# Patient Record
Sex: Female | Born: 1970 | Race: Black or African American | Hispanic: No | Marital: Married | Smoking: Former smoker
Health system: Southern US, Community
[De-identification: ages and names within clinical notes are randomized; demographics above are authoritative.]

## PROBLEM LIST (undated history)

## (undated) DIAGNOSIS — R202 Paresthesia of skin: Secondary | ICD-10-CM

## (undated) DIAGNOSIS — K219 Gastro-esophageal reflux disease without esophagitis: Secondary | ICD-10-CM

## (undated) DIAGNOSIS — F419 Anxiety disorder, unspecified: Secondary | ICD-10-CM

## (undated) DIAGNOSIS — I10 Essential (primary) hypertension: Secondary | ICD-10-CM

## (undated) HISTORY — DX: Gastro-esophageal reflux disease without esophagitis: K21.9

## (undated) HISTORY — PX: HERNIA REPAIR: SHX51

---

## 2000-01-12 ENCOUNTER — Emergency Department (HOSPITAL_COMMUNITY): Admission: EM | Admit: 2000-01-12 | Discharge: 2000-01-12 | Payer: Self-pay | Admitting: Emergency Medicine

## 2000-04-24 ENCOUNTER — Emergency Department (HOSPITAL_COMMUNITY): Admission: EM | Admit: 2000-04-24 | Discharge: 2000-04-24 | Payer: Self-pay | Admitting: Emergency Medicine

## 2003-09-17 ENCOUNTER — Emergency Department (HOSPITAL_COMMUNITY): Admission: EM | Admit: 2003-09-17 | Discharge: 2003-09-17 | Payer: Self-pay | Admitting: Emergency Medicine

## 2015-06-16 ENCOUNTER — Encounter (HOSPITAL_COMMUNITY): Payer: Self-pay | Admitting: Vascular Surgery

## 2015-06-16 ENCOUNTER — Emergency Department (HOSPITAL_COMMUNITY): Payer: BC Managed Care – PPO

## 2015-06-16 ENCOUNTER — Emergency Department (HOSPITAL_COMMUNITY)
Admission: EM | Admit: 2015-06-16 | Discharge: 2015-06-16 | Disposition: A | Discharge status: Home / Self Care | Payer: BC Managed Care – PPO | Attending: Emergency Medicine | Admitting: Emergency Medicine

## 2015-06-16 DIAGNOSIS — F17210 Nicotine dependence, cigarettes, uncomplicated: Secondary | ICD-10-CM | POA: Diagnosis not present

## 2015-06-16 DIAGNOSIS — H9311 Tinnitus, right ear: Secondary | ICD-10-CM | POA: Diagnosis not present

## 2015-06-16 DIAGNOSIS — Y998 Other external cause status: Secondary | ICD-10-CM | POA: Insufficient documentation

## 2015-06-16 DIAGNOSIS — S199XXA Unspecified injury of neck, initial encounter: Secondary | ICD-10-CM | POA: Insufficient documentation

## 2015-06-16 DIAGNOSIS — Y92410 Unspecified street and highway as the place of occurrence of the external cause: Secondary | ICD-10-CM | POA: Diagnosis not present

## 2015-06-16 DIAGNOSIS — Y9389 Activity, other specified: Secondary | ICD-10-CM | POA: Diagnosis not present

## 2015-06-16 MED ORDER — ACETAMINOPHEN 500 MG PO TABS: 500.0000 mg | ORAL_TABLET | Freq: Four times a day (QID) | ORAL | Status: DC | PRN

## 2015-06-16 NOTE — ED Notes (Signed)
Pt ambulating independently w/ steady gait on d/c in no acute distress, A&Ox4. D/c instructions reviewed w/ pt and family - pt and family deny any further questions or concerns at present. Rx given x1  
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
## 2015-06-16 NOTE — ED Notes (Signed)
Patient transported to X-ray 

## 2015-06-16 NOTE — ED Notes (Signed)
See PAs note for secondary.
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
## 2015-11-24 ENCOUNTER — Other Ambulatory Visit (HOSPITAL_COMMUNITY)
Admission: RE | Admit: 2015-11-24 | Discharge: 2015-11-24 | Disposition: A | Discharge status: Home / Self Care | Payer: BC Managed Care – PPO | Source: Ambulatory Visit | Attending: Family Medicine | Admitting: Family Medicine

## 2015-11-24 ENCOUNTER — Other Ambulatory Visit: Payer: Self-pay | Admitting: Family Medicine

## 2015-11-24 DIAGNOSIS — Z113 Encounter for screening for infections with a predominantly sexual mode of transmission: Secondary | ICD-10-CM | POA: Diagnosis present

## 2015-11-24 DIAGNOSIS — Z01419 Encounter for gynecological examination (general) (routine) without abnormal findings: Secondary | ICD-10-CM | POA: Insufficient documentation

## 2015-11-24 DIAGNOSIS — Z1151 Encounter for screening for human papillomavirus (HPV): Secondary | ICD-10-CM | POA: Insufficient documentation

## 2015-11-24 DIAGNOSIS — N760 Acute vaginitis: Secondary | ICD-10-CM | POA: Insufficient documentation

## 2015-11-28 LAB — CYTOLOGY - PAP

## 2019-10-07 ENCOUNTER — Ambulatory Visit: Payer: BC Managed Care – PPO

## 2019-10-14 ENCOUNTER — Ambulatory Visit: Payer: BC Managed Care – PPO | Attending: Family

## 2019-10-14 DIAGNOSIS — Z23 Encounter for immunization: Secondary | ICD-10-CM

## 2019-10-14 NOTE — Progress Notes (Signed)
   Covid-19 Vaccination Clinic  Name:  Pamela Ellis    MRN: [id number] DOB: 08/08/1970  10/14/2019  [name] was observed post Covid-19 immunization for 15 minutes without incident. She was provided with Vaccine Information Sheet and instruction to access the V-Safe system.   [name] was instructed to call 911 with any severe reactions post vaccine: Marland Kitchen Difficulty breathing  . Swelling of face and throat  . A fast heartbeat  . A bad rash all over body  . Dizziness and weakness   Immunizations Administered    Name Date Dose VIS Date Route   Moderna COVID-19 Vaccine 10/14/2019  3:52 PM 0.5 mL 06/22/2019 Intramuscular   Manufacturer: Moderna   Lot: [id number]   NDC: 25498-264-15

## 2019-11-16 ENCOUNTER — Ambulatory Visit: Payer: BC Managed Care – PPO | Attending: Family

## 2019-11-16 DIAGNOSIS — Z23 Encounter for immunization: Secondary | ICD-10-CM

## 2019-11-16 NOTE — Progress Notes (Signed)
   Covid-19 Vaccination Clinic  Name:  Pamela Ellis    MRN: [id number] DOB: Feb 19, 1971  11/16/2019  Pamela Ellis was observed post Covid-19 immunization for 15 minutes without incident. She was provided with Vaccine Information Sheet and instruction to access the V-Safe system.   Pamela Ellis was instructed to call 911 with any severe reactions post vaccine: Marland Kitchen Difficulty breathing  . Swelling of face and throat  . A fast heartbeat  . A bad rash all over body  . Dizziness and weakness   Immunizations Administered    Name Date Dose VIS Date Route   Moderna COVID-19 Vaccine 11/16/2019  3:32 PM 0.5 mL 06/2019 Intramuscular   Manufacturer: Moderna   Lot: [id number]   NDC: 30865-784-69

## 2020-03-09 ENCOUNTER — Other Ambulatory Visit: Payer: Self-pay | Admitting: Family Medicine

## 2020-03-09 DIAGNOSIS — Z1231 Encounter for screening mammogram for malignant neoplasm of breast: Secondary | ICD-10-CM

## 2020-03-15 ENCOUNTER — Other Ambulatory Visit: Payer: Self-pay

## 2020-03-15 ENCOUNTER — Ambulatory Visit
Admission: RE | Admit: 2020-03-15 | Discharge: 2020-03-15 | Disposition: A | Discharge status: Home / Self Care | Payer: BC Managed Care – PPO | Source: Ambulatory Visit | Attending: Family Medicine | Admitting: Family Medicine

## 2020-03-15 DIAGNOSIS — Z1231 Encounter for screening mammogram for malignant neoplasm of breast: Secondary | ICD-10-CM

## 2021-10-19 ENCOUNTER — Encounter: Payer: Self-pay | Admitting: Neurology

## 2021-12-24 ENCOUNTER — Other Ambulatory Visit: Payer: Self-pay

## 2021-12-24 ENCOUNTER — Emergency Department (HOSPITAL_BASED_OUTPATIENT_CLINIC_OR_DEPARTMENT_OTHER)
Admission: EM | Admit: 2021-12-24 | Discharge: 2021-12-24 | Disposition: A | Discharge status: Home / Self Care | Payer: BC Managed Care – PPO | Attending: Emergency Medicine | Admitting: Emergency Medicine

## 2021-12-24 ENCOUNTER — Emergency Department (HOSPITAL_BASED_OUTPATIENT_CLINIC_OR_DEPARTMENT_OTHER): Payer: BC Managed Care – PPO

## 2021-12-24 DIAGNOSIS — M549 Dorsalgia, unspecified: Secondary | ICD-10-CM | POA: Diagnosis not present

## 2021-12-24 DIAGNOSIS — R072 Precordial pain: Secondary | ICD-10-CM | POA: Insufficient documentation

## 2021-12-24 DIAGNOSIS — R079 Chest pain, unspecified: Secondary | ICD-10-CM | POA: Diagnosis present

## 2021-12-24 LAB — BASIC METABOLIC PANEL
Anion gap: 7 (ref 5–15)
BUN: 8 mg/dL (ref 6–20)
CO2: 25 mmol/L (ref 22–32)
Calcium: 9.3 mg/dL (ref 8.9–10.3)
Chloride: 106 mmol/L (ref 98–111)
Creatinine, Ser: 0.77 mg/dL (ref 0.44–1.00)
GFR, Estimated: >60 mL/min (ref >60)
Glucose, Bld: 100 mg/dL — ABNORMAL HIGH (ref 70–99)
Potassium: 4.1 mmol/L (ref 3.5–5.1)
Sodium: 138 mmol/L (ref 135–145)

## 2021-12-24 LAB — CBC
HCT: 40.6 % (ref 36.0–46.0)
Hemoglobin: 13.1 g/dL (ref 12.0–15.0)
MCH: 27.3 pg (ref 26.0–34.0)
MCHC: 32.3 g/dL (ref 30.0–36.0)
MCV: 84.6 fL (ref 80.0–100.0)
Platelets: 310 10*3/uL (ref 150–400)
RBC: 4.8 MIL/uL (ref 3.87–5.11)
RDW: 13.3 % (ref 11.5–15.5)
WBC: 6 10*3/uL (ref 4.0–10.5)
nRBC: 0 % (ref 0.0–0.2)

## 2021-12-24 LAB — HEPATIC FUNCTION PANEL
ALT: 11 U/L (ref 0–44)
AST: 16 U/L (ref 15–41)
Albumin: 3.8 g/dL (ref 3.5–5.0)
Alkaline Phosphatase: 94 U/L (ref 38–126)
Bilirubin, Direct: <0.1 mg/dL (ref 0.0–0.2)
Total Bilirubin: 0.5 mg/dL (ref 0.3–1.2)
Total Protein: 7.9 g/dL (ref 6.5–8.1)

## 2021-12-24 LAB — TROPONIN I (HIGH SENSITIVITY)
Troponin I (High Sensitivity): <2 ng/L (ref <18)
Troponin I (High Sensitivity): <2 ng/L (ref <18)

## 2021-12-24 LAB — PREGNANCY, URINE: Preg Test, Ur: NEGATIVE

## 2021-12-24 LAB — LIPASE, BLOOD: Lipase: 21 U/L (ref 11–51)

## 2021-12-24 MED ORDER — LIDOCAINE VISCOUS HCL 2 % MT SOLN
15.0000 mL | Freq: Once | OROMUCOSAL | Status: AC
  Administered 2021-12-24: 15 mL via ORAL
  Filled 2021-12-24: qty 15

## 2021-12-24 MED ORDER — PANTOPRAZOLE SODIUM 40 MG PO TBEC: 40.0000 mg | DELAYED_RELEASE_TABLET | Freq: Every day | ORAL | 0 refills | Status: DC

## 2021-12-24 MED ORDER — ALUM & MAG HYDROXIDE-SIMETH 200-200-20 MG/5ML PO SUSP
30.0000 mL | Freq: Once | ORAL | Status: AC
  Administered 2021-12-24: 30 mL via ORAL
  Filled 2021-12-24: qty 30

## 2021-12-24 MED ORDER — DICYCLOMINE HCL 20 MG PO TABS: 20.0000 mg | ORAL_TABLET | Freq: Three times a day (TID) | ORAL | 0 refills | Status: DC | PRN

## 2021-12-24 NOTE — ED Triage Notes (Signed)
C/O a "crawling / prickly sensation" going from lower back up for few weeks; and the past few days now feeling it in the chest and abdominal area;

## 2021-12-24 NOTE — Discharge Instructions (Addendum)
You are seen in the department today with back and chest discomfort.  Your lab work did not show evidence of heart attack but I would like for you to follow with a cardiologist.  I have placed a referral to help with this.  Please follow with your primary care doctor to continue symptom management.  If you develop any new or suddenly worsening symptoms please return to the emergency department for reevaluation.

## 2021-12-24 NOTE — ED Provider Notes (Signed)
Emergency Department Provider Note   I have reviewed the triage vital signs and the nursing notes.   HISTORY  Chief Complaint Back Pain and Chest Pain   HPI Pamela Ellis is a 51 y.o. female presents to the ED "tingly" discomfort in the right back for the last several months now newly radiating to the right lateral chest. Denies any rash. No fever. No abdominal pain or vomiting. No similar pain in the past. No pleuritic pain.   No past medical history on file.  Review of Systems {** Revise as appropriate then delete this line - Documentation of 10 systems OR 2 systems and "10-point ROS otherwise negative" is required **}Constitutional: No fever/chills Eyes: No visual changes. ENT: No sore throat. Cardiovascular: Denies chest pain. Respiratory: Denies shortness of breath. Gastrointestinal: No abdominal pain.  No nausea, no vomiting.  No diarrhea.  No constipation. Genitourinary: Negative for dysuria. Musculoskeletal: Negative for back pain. Skin: Negative for rash. Neurological: Negative for headaches, focal weakness or numbness. {**Psychiatric:  Endocrine:  Hematological/Lymphatic:  Allergic/Immunilogical: **}  ____________________________________________   PHYSICAL EXAM:  VITAL SIGNS: ED Triage Vitals  Enc Vitals Group     BP 12/24/21 1122 (!) 146/109     Pulse Rate 12/24/21 1122 78     Resp 12/24/21 1122 17     Temp 12/24/21 1122 98.3 F (36.8 C)     Temp Source 12/24/21 1122 Oral     SpO2 12/24/21 1122 100 %     Weight 12/24/21 1124 190 lb (86.2 kg)     Height 12/24/21 1124 5\' 4"  (1.626 m)     Head Circumference --      Peak Flow --      Pain Score 12/24/21 1124 10     Pain Loc --      Pain Edu? --      Excl. in GC? --    {** Revise as appropriate then delete this line - 8 systems required **} Constitutional: Alert and oriented. Well appearing and in no acute distress. Eyes: Conjunctivae are normal. PERRL. EOMI. Head: Atraumatic. {**Ears:   Healthy appearing ear canals and TMs bilaterally **}Nose: No congestion/rhinnorhea. Mouth/Throat: Mucous membranes are moist.  Oropharynx non-erythematous. Neck: No stridor.  No meningeal signs.  {**No cervical spine tenderness to palpation.**} Cardiovascular: Normal rate, regular rhythm. Good peripheral circulation. Grossly normal heart sounds.   Respiratory: Normal respiratory effort.  No retractions. Lungs CTAB. Gastrointestinal: Soft and nontender. No distention.  {**Genitourinary:  **}Musculoskeletal: No lower extremity tenderness nor edema. No gross deformities of extremities. Neurologic:  Normal speech and language. No gross focal neurologic deficits are appreciated.  Skin:  Skin is warm, dry and intact. No rash noted. {**Psychiatric: Mood and affect are normal. Speech and behavior are normal.**}  ____________________________________________   LABS (all labs ordered are listed, but only abnormal results are displayed)  Labs Reviewed  BASIC METABOLIC PANEL - Abnormal; Notable for the following components:      Result Value   Glucose, Bld 100 (*)    All other components within normal limits  CBC  PREGNANCY, URINE  HEPATIC FUNCTION PANEL  LIPASE, BLOOD  TROPONIN I (HIGH SENSITIVITY)  TROPONIN I (HIGH SENSITIVITY)   ____________________________________________  EKG   EKG Interpretation  Date/Time:  Monday December 24 2021 11:44:12 EDT Ventricular Rate:  61 PR Interval:  128 QRS Duration: 72 QT Interval:  400 QTC Calculation: 402 R Axis:   53 Text Interpretation: Normal sinus rhythm Normal ECG No previous ECGs available Confirmed  by Alona Bene [phone]) on 12/24/2021 3:07:05 PM        ____________________________________________  RADIOLOGY  DG Chest 2 View  Result Date: 12/24/2021 CLINICAL DATA:  Chest pain.  Abnormal sensation. EXAM: CHEST - 2 VIEW COMPARISON:  None FINDINGS: Heart size is normal. Mediastinal shadows are normal. The lungs are clear. No bronchial  thickening. No infiltrate, mass, effusion or collapse. Pulmonary vascularity is normal. No bony abnormality. IMPRESSION: Normal chest Electronically Signed   By: Paulina Fusi M.D.   On: 12/24/2021 11:38    ____________________________________________   PROCEDURES  Procedure(s) performed:   Procedures   ____________________________________________   INITIAL IMPRESSION / ASSESSMENT AND PLAN / ED COURSE  Pertinent labs & imaging results that were available during my care of the patient were reviewed by me and considered in my medical decision making (see chart for details).   This patient is Presenting for Evaluation of ***, which {Range:23949} require a range of treatment options, and {MDMcomplaint:23950} a complaint that involves a {MDMlevelrisk:23951} risk of morbidity and mortality.  The Differential Diagnoses include***.  Critical Interventions-    Medications  alum & mag hydroxide-simeth (MAALOX/MYLANTA) 200-200-20 MG/5ML suspension 30 mL (has no administration in time range)    And  lidocaine (XYLOCAINE) 2 % viscous mouth solution 15 mL (has no administration in time range)    Reassessment after intervention:     I *** Additional Historical Information from ***, as the patient is ***.  I decided to review pertinent External Data, and in summary ***.   Clinical Laboratory Tests Ordered, included   Radiologic Tests Ordered, included ***. I independently interpreted the images and agree with radiology interpretation.   Cardiac Monitor Tracing which shows ***   Social Determinants of Health Risk ***  Consult complete with  Medical Decision Making: Summary: ***  Reevaluation with update and discussion with   ***Considered admission***  Disposition:   ____________________________________________  FINAL CLINICAL IMPRESSION(S) / ED DIAGNOSES  Final diagnoses:  None     NEW OUTPATIENT MEDICATIONS STARTED DURING THIS VISIT:  New Prescriptions   No  medications on file    Note:  This document was prepared using Dragon voice recognition software and may include unintentional dictation errors.  Alona Bene, MD, [facility] Emergency Medicine

## 2021-12-24 NOTE — ED Provider Notes (Incomplete)
Emergency Department Provider Note   I have reviewed the triage vital signs and the nursing notes.   HISTORY  Chief Complaint Back Pain and Chest Pain   HPI Pamela Ellis is a 51 y.o. female ***   {**SYMPTOM/COMPLAINT  LOCATION (describe anatomically) DURATION (when did it start) TIMING (onset and pattern) SEVERITY (0-10, mild/moderate/severe) QUALITY (description of symptoms) CONTEXT (recent surgery, new meds, activity, etc.) MODIFYINGFACTORS (what makes it better/worse) ASSOCIATEDSYMPTOMS (pertinent positives and negatives)**}  No past medical history on file.  Review of Systems {** Revise as appropriate then delete this line - Documentation of 10 systems OR 2 systems and "10-point ROS otherwise negative" is required **}Constitutional: No fever/chills Eyes: No visual changes. ENT: No sore throat. Cardiovascular: Denies chest pain. Respiratory: Denies shortness of breath. Gastrointestinal: No abdominal pain.  No nausea, no vomiting.  No diarrhea.  No constipation. Genitourinary: Negative for dysuria. Musculoskeletal: Negative for back pain. Skin: Negative for rash. Neurological: Negative for headaches, focal weakness or numbness. {**Psychiatric:  Endocrine:  Hematological/Lymphatic:  Allergic/Immunilogical: **}  ____________________________________________   PHYSICAL EXAM:  VITAL SIGNS: ED Triage Vitals  Enc Vitals Group     BP 12/24/21 1122 (!) 146/109     Pulse Rate 12/24/21 1122 78     Resp 12/24/21 1122 17     Temp 12/24/21 1122 98.3 F (36.8 C)     Temp Source 12/24/21 1122 Oral     SpO2 12/24/21 1122 100 %     Weight 12/24/21 1124 190 lb (86.2 kg)     Height 12/24/21 1124 5\' 4"  (1.626 m)     Head Circumference --      Peak Flow --      Pain Score 12/24/21 1124 10     Pain Loc --      Pain Edu? --      Excl. in GC? --    {** Revise as appropriate then delete this line - 8 systems required **} Constitutional: Alert and oriented. Well  appearing and in no acute distress. Eyes: Conjunctivae are normal. PERRL. EOMI. Head: Atraumatic. {**Ears:  Healthy appearing ear canals and TMs bilaterally **}Nose: No congestion/rhinnorhea. Mouth/Throat: Mucous membranes are moist.  Oropharynx non-erythematous. Neck: No stridor.  No meningeal signs.  {**No cervical spine tenderness to palpation.**} Cardiovascular: Normal rate, regular rhythm. Good peripheral circulation. Grossly normal heart sounds.   Respiratory: Normal respiratory effort.  No retractions. Lungs CTAB. Gastrointestinal: Soft and nontender. No distention.  {**Genitourinary:  **}Musculoskeletal: No lower extremity tenderness nor edema. No gross deformities of extremities. Neurologic:  Normal speech and language. No gross focal neurologic deficits are appreciated.  Skin:  Skin is warm, dry and intact. No rash noted. {**Psychiatric: Mood and affect are normal. Speech and behavior are normal.**}  ____________________________________________   LABS (all labs ordered are listed, but only abnormal results are displayed)  Labs Reviewed  BASIC METABOLIC PANEL - Abnormal; Notable for the following components:      Result Value   Glucose, Bld 100 (*)    All other components within normal limits  CBC  PREGNANCY, URINE  HEPATIC FUNCTION PANEL  LIPASE, BLOOD  TROPONIN I (HIGH SENSITIVITY)  TROPONIN I (HIGH SENSITIVITY)   ____________________________________________  EKG  *** ____________________________________________  RADIOLOGY  DG Chest 2 View  Result Date: 12/24/2021 CLINICAL DATA:  Chest pain.  Abnormal sensation. EXAM: CHEST - 2 VIEW COMPARISON:  None FINDINGS: Heart size is normal. Mediastinal shadows are normal. The lungs are clear. No bronchial thickening. No infiltrate, mass, effusion  or collapse. Pulmonary vascularity is normal. No bony abnormality. IMPRESSION: Normal chest Electronically Signed   By: Paulina Fusi M.D.   On: 12/24/2021 11:38     ____________________________________________   PROCEDURES  Procedure(s) performed:   Procedures   ____________________________________________   INITIAL IMPRESSION / ASSESSMENT AND PLAN / ED COURSE  Pertinent labs & imaging results that were available during my care of the patient were reviewed by me and considered in my medical decision making (see chart for details).   This patient is Presenting for Evaluation of ***, which {Range:23949} require a range of treatment options, and {MDMcomplaint:23950} a complaint that involves a {MDMlevelrisk:23951} risk of morbidity and mortality.  The Differential Diagnoses include***.  Critical Interventions-    Medications - No data to display  Reassessment after intervention:     I *** Additional Historical Information from ***, as the patient is ***.  I decided to review pertinent External Data, and in summary ***.   Clinical Laboratory Tests Ordered, included   Radiologic Tests Ordered, included ***. I independently interpreted the images and agree with radiology interpretation.   Cardiac Monitor Tracing which shows ***   Social Determinants of Health Risk ***  Consult complete with  Medical Decision Making: Summary: ***  Reevaluation with update and discussion with   ***Considered admission***  Disposition:   ____________________________________________  FINAL CLINICAL IMPRESSION(S) / ED DIAGNOSES  Final diagnoses:  None     NEW OUTPATIENT MEDICATIONS STARTED DURING THIS VISIT:  New Prescriptions   No medications on file    Note:  This document was prepared using Dragon voice recognition software and may include unintentional dictation errors.  Alona Bene, MD, [facility]

## 2021-12-24 NOTE — ED Notes (Signed)
PT states pain is the same as arrival at this time

## 2021-12-26 ENCOUNTER — Ambulatory Visit: Payer: BC Managed Care – PPO | Admitting: Cardiology

## 2021-12-26 ENCOUNTER — Encounter: Payer: Self-pay | Admitting: Cardiology

## 2021-12-26 DIAGNOSIS — R202 Paresthesia of skin: Secondary | ICD-10-CM

## 2021-12-26 NOTE — Addendum Note (Signed)
Addended by: Molli Barrows on: 12/26/2021 03:18 PM   Modules accepted: Orders

## 2021-12-26 NOTE — Progress Notes (Signed)
Cardiology Office Note:    Date:  12/26/2021   ID:  Pamela Ellis, Pamela Ellis [birth year], MRN [id number]  PCP:  Wilfrid Lund, PA   [facility] Providers Cardiologist:  Donato Schultz, MD     Referring MD: Maia Plan, MD    History of Present Illness:    Pamela Ellis is a 51 y.o. female here for evaluation of right-sided chest pain at the request of Dr. Jacqulyn Bath.  She had an emergency department visit on 12/24/2021.  Which she describes as a sensation of being uncomfortable with tingling-like chest discomfort especially when sitting straight up for a period of time.  This has been going on since December.  She feels like something is "sick "in there.  The pain then recently started banding around her right chest wall under her right breast.  She has had shingles vaccine.  She has not noticed any rashes.  She is not having any exertional component.  When she lays down on her side she feels better.    No rashes no fever no chills no syncope.  Troponin values were normal.  Normal hemoglobin.  Pregnancy test was normal.  No past medical history on file.  Past Surgical History:  Procedure Laterality Date   HERNIA REPAIR      Current Medications: Current Meds  Medication Sig   acetaminophen (TYLENOL) 500 MG tablet Take 1 tablet (500 mg total) by mouth every 6 (six) hours as needed.   dicyclomine (BENTYL) 20 MG tablet Take 1 tablet (20 mg total) by mouth 3 (three) times daily as needed for spasms.   pantoprazole (PROTONIX) 40 MG tablet Take 1 tablet (40 mg total) by mouth daily.     Allergies:   Patient has no known allergies.   Social History   Socioeconomic History   Marital status: Married    Spouse name: Not on file   Number of children: Not on file   Years of education: Not on file   Highest education level: Not on file  Occupational History   Not on file  Tobacco Use   Smoking status: Every Day    Packs/day: 0.20    Types: Cigarettes   Smokeless tobacco: Never   Substance and Sexual Activity   Alcohol use: Yes    Comment: occasionally   Drug use: No   Sexual activity: Not on file  Other Topics Concern   Not on file  Social History Narrative   Not on file   Social Determinants of Health   Financial Resource Strain: Not on file  Food Insecurity: Not on file  Transportation Needs: Not on file  Physical Activity: Not on file  Stress: Not on file  Social Connections: Not on file     Family History: The patient's no early family history of CAD   ROS:   Please see the history of present illness.    Denies any fevers chills nausea vomiting, no weight loss no rash all other systems reviewed and are negative.  EKGs/Labs/Other Studies Reviewed:    The following studies were reviewed today: Prior ER studies reviewed.  Chest x-ray personally reviewed shows no abnormality.  EKG:  EKG on 12/24/2021 shows sinus rhythm 61 with no other abnormalities  Recent Labs: 12/24/2021: ALT 11; BUN 8; Creatinine, Ser 0.77; Hemoglobin 13.1; Platelets 310; Potassium 4.1; Sodium 138  Recent Lipid Panel No results found for: CHOL, TRIG, HDL, CHOLHDL, VLDL, LDLCALC, LDLDIRECT   Risk Assessment/Calculations:  Physical Exam:    VS:  BP 120/80 (BP Location: Left Arm, Patient Position: Sitting, Cuff Size: Normal)   Pulse 85   Ht 5\' 4"  (1.626 m)   Wt 184 lb (83.5 kg)   SpO2 97%   BMI 31.58 kg/m     Wt Readings from Last 3 Encounters:  12/26/21 184 lb (83.5 kg)  12/24/21 190 lb (86.2 kg)     GEN:  Well nourished, well developed in no acute distress HEENT: Normal NECK: No JVD; No carotid bruits LYMPHATICS: No lymphadenopathy CARDIAC: RRR, no murmurs, no rubs, gallops RESPIRATORY:  Clear to auscultation without rales, wheezing or rhonchi  ABDOMEN: Soft, non-tender, non-distended MUSCULOSKELETAL:  No edema; No deformity  SKIN: Warm and dry NEUROLOGIC:  Alert and oriented x 3, normal grip strength, no obvious weakness PSYCHIATRIC:   Normal affect   ASSESSMENT:    1. Paresthesias    PLAN:    In order of problems listed above:  Paresthesias Paresthesias noted mostly right/under right breast.  Can also encompass right shoulder region.  She is mostly uncomfortable when sitting up straight for several minutes.  I wonder if there is thoracic spinal stenosis present.  She has not been able to get in with neurology until September.  We will go ahead and order her an MRI of the spine for further evaluation.  We discussed shingles however she has never had a rash and some of her symptoms have been present since December.  If MRI is unremarkable, physical therapy may be helpful.  I do not feel that any further cardiac evaluation is necessary at this time.         Medication Adjustments/Labs and Tests Ordered: Current medicines are reviewed at length with the patient today.  Concerns regarding medicines are outlined above.  Orders Placed This Encounter  Procedures   MR TOTAL SPINE W WO CONTRAST   No orders of the defined types were placed in this encounter.   Patient Instructions  Medication Instructions:  Your physician recommends that you continue on your current medications as directed. Please refer to the Current Medication list given to you today.  *If you need a refill on your cardiac medications before your next appointment, please call your pharmacy*  Lab Work: NONE  Testing/Procedures: Your physician has requested you have a MRI total spine performed.  Follow-Up: At [facility], you and your health needs are our priority.  As part of our continuing mission to provide you with exceptional heart care, we have created designated Provider Care Teams.  These Care Teams include your primary Cardiologist (physician) and Advanced Practice Providers (APPs -  Physician Assistants and Nurse Practitioners) who all work together to provide you with the care you need, when you need it.  Your next appointment:    As needed  The format for your next appointment:   In Person  Provider:   [facility], MD {   Important Information About Sugar         Signed, Donato Schultz, MD  12/26/2021 2:59 PM    [facility]

## 2021-12-26 NOTE — Patient Instructions (Signed)
Medication Instructions:  Your physician recommends that you continue on your current medications as directed. Please refer to the Current Medication list given to you today.  *If you need a refill on your cardiac medications before your next appointment, please call your pharmacy*  Lab Work: NONE  Testing/Procedures: Your physician has requested you have a MRI total spine performed.  Follow-Up: At [facility], you and your health needs are our priority.  As part of our continuing mission to provide you with exceptional heart care, we have created designated Provider Care Teams.  These Care Teams include your primary Cardiologist (physician) and Advanced Practice Providers (APPs -  Physician Assistants and Nurse Practitioners) who all work together to provide you with the care you need, when you need it.  Your next appointment:   As needed  The format for your next appointment:   In Person  Provider:   Donato Schultz, MD {   Important Information About Sugar

## 2021-12-26 NOTE — Assessment & Plan Note (Signed)
Paresthesias noted mostly right/under right breast.  Can also encompass right shoulder region.  She is mostly uncomfortable when sitting up straight for several minutes.  I wonder if there is thoracic spinal stenosis present.  She has not been able to get in with neurology until September.  We will go ahead and order her an MRI of the spine for further evaluation.  We discussed shingles however she has never had a rash and some of her symptoms have been present since December.  If MRI is unremarkable, physical therapy may be helpful.  I do not feel that any further cardiac evaluation is necessary at this time.

## 2022-01-07 ENCOUNTER — Ambulatory Visit (HOSPITAL_COMMUNITY)
Admission: RE | Admit: 2022-01-07 | Discharge: 2022-01-07 | Disposition: A | Discharge status: Home / Self Care | Payer: BC Managed Care – PPO | Source: Ambulatory Visit | Attending: Cardiology | Admitting: Cardiology

## 2022-01-07 DIAGNOSIS — R202 Paresthesia of skin: Secondary | ICD-10-CM

## 2022-04-02 NOTE — Progress Notes (Unsigned)
[facility] Neurology Division Clinic Note - Initial Visit   Date: 04/03/2022   Pamela Ellis MRN: [id number] DOB: 13-May-1971   Dear Dr. Anne Fu:  Thank you for your kind referral of Pamela Ellis for consultation of abnormal skin sensation. Although her history is well known to you, please allow [name] to reiterate it for the purpose of our medical record. The patient was accompanied to the clinic by wife who also provides collateral information.     Pamela Ellis is a 51 y.o. right-handed female with GERD presenting for evaluation of abnormal skin sensation.   IMPRESSION/PLAN: Dysesthesias involving the back.  Symptoms do not conform to a cutaneous nerve distribution.  Neurological exam is normal.  MRI cervical and thoracic spine does not show any compressive pathology.  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  I offered to check vitamin B12, folate, and B1 levels given alcohol use.  She asked that we review her labs taken by PCP first, so this has been requested. I explained that I did not see anything worrisome on her MRI cervical or thoracic spine.  Often when we do not find underlying etiology, we can treat symptomatically. She would like to try gabapentin 300mg  bedtime, titrated as tolerable.   Return to clinic in 4 months  ------------------------------------------------------------- History of present illness: Starting in November 2022, she began having spells of tingling/itching/crawling sensation going up her spine and goes in to the she of her chest.  It lasts anywhere from seconds to hours.  Symptoms can occur at rest or with activity.   Pressure on her back triggers it, but other times it makes it better.  It is worse under stressful conditions.  There was no preceding injury or  illness.   She does not have any back pain.   Nonsmoker.  She drinks at least glasses of wine 4-5 times per week.  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  She lives with wife.   Out-side paper records, electronic medical record, and images have been reviewed where available and summarized as:  MRI cervical, thoracic, and lumbar spine 01/08/2022: 1. 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 Mild right neural foraminal stenosis at L4-L5, and moderate bilateral neural foraminal stenosis at L5-S1. 2. Essentially normal cervical spine MRI with minimal degenerative changes without significant spinal canal or neural foraminal stenosis. 3. Small disc protrusion at T7-T8 without significant spinal canal or neural foraminal stenosis. Otherwise, unremarkable thoracic spine MRI without significant spinal canal or neural foraminal stenosis, and no evidence of cord or nerve root compression. 4. Enlarged fibroid uterus.   Past Medical History:  Diagnosis Date   GERD (gastroesophageal reflux disease)     Past Surgical History:  Procedure Laterality Date   HERNIA REPAIR       Medications:  Outpatient Encounter Medications as of 04/03/2022  Medication Sig   cyanocobalamin (VITAMIN B12) 500 MCG tablet Take 500 mcg by mouth daily.   Ergocalciferol (VITAMIN D2 PO) Take by mouth.   gabapentin (NEURONTIN) 300 MG capsule Take 1 capsule (300 mg total) by mouth at bedtime.   acetaminophen (TYLENOL) 500 MG tablet Take 1 tablet (500 mg  total) by mouth every 6 (six) hours as needed. (Patient not taking: Reported on 04/03/2022)   dicyclomine (BENTYL) 20 MG tablet Take 1 tablet (20 mg total) by mouth 3 (three) times daily as needed for spasms. (Patient not taking: Reported on 04/03/2022)   pantoprazole (PROTONIX) 40 MG tablet Take 1  tablet (40 mg total) by mouth daily. (Patient not taking: Reported on 04/03/2022)   No facility-administered encounter medications on file as of 04/03/2022.    Allergies: No Known Allergies  Family History: Family History  Problem Relation Age of Onset   Dementia Mother    Hepatitis C Father     Social History: Social History   Tobacco Use   Smoking status: Every Day    Packs/day: 0.20    Types: Cigarettes   Smokeless tobacco: Never  Substance Use Topics   Alcohol use: Yes    Comment: occasional glass of wine   Drug use: No   Social History   Social History Narrative   Right Handed    Lives in a one story home     Vital Signs:  BP (!) 164/90   Pulse 72   Ht 5\' 4"  (1.626 m)   Wt 185 lb (83.9 kg)   SpO2 100%   BMI 31.76 kg/m   Neurological Exam: MENTAL STATUS including orientation to time, place, person, recent and remote memory, attention span and concentration, language, and fund of knowledge is normal.  Speech is not dysarthric.  CRANIAL NERVES: II:  No visual field defects.   III-IV-VI: Pupils equal round and reactive to light.  Normal conjugate, extra-ocular eye movements in all directions of gaze.  No nystagmus.  No ptosis.   V:  Normal facial sensation.    VII:  Normal facial symmetry and movements.   VIII:  Normal hearing and vestibular function.   IX-X:  Normal palatal movement.   XI:  Normal shoulder shrug and head rotation.   XII:  Normal tongue strength and range of motion, no deviation or fasciculation.  MOTOR:  No atrophy, fasciculations or abnormal movements.  No pronator drift.   Upper Extremity:  Right  Left  Deltoid  5/5   5/5   Biceps  5/5   5/5   Triceps  5/5   5/5   Wrist extensors  5/5   5/5   Wrist flexors  5/5   5/5   Finger extensors  5/5   5/5   Finger flexors  5/5   5/5   Dorsal interossei  5/5   5/5   Abductor pollicis  5/5   5/5   Tone (Ashworth scale)  0  0   Lower Extremity:  Right  Left  Hip flexors  5/5   5/5   Knee  flexors  5/5   5/5   Knee extensors  5/5   5/5   Dorsiflexors  5/5   5/5   Plantarflexors  5/5   5/5   Toe extensors  5/5   5/5   Toe flexors  5/5   5/5   Tone (Ashworth scale)  0  0   MSRs:  Right        Left                  brachioradialis 2+  2+  biceps 2+  2+  triceps 2+  2+  patellar 2+  2+  ankle jerk 2+  2+  Hoffman no  no  plantar response down  down   SENSORY:  Normal and symmetric perception of light touch, pinprick, vibration.  Romberg's sign absent. There is no sensory level.  COORDINATION/GAIT: Normal finger-to- nose-finger.  Intact rapid alternating movements bilaterally. Gait narrow based and stable. Tandem and stressed gait intact.    Thank you for allowing me to participate in patient's care.  If I can answer any additional questions, I would be pleased to do so.    Sincerely,    Velina Drollinger K. Allena Katz, DO

## 2022-04-03 ENCOUNTER — Encounter: Payer: Self-pay | Admitting: Neurology

## 2022-04-03 ENCOUNTER — Ambulatory Visit: Payer: BC Managed Care – PPO | Admitting: Neurology

## 2022-04-03 VITALS — BP 164/90 | HR 72 | Ht 64.0 in | Wt 185.0 lb

## 2022-04-03 DIAGNOSIS — R208 Other disturbances of skin sensation: Secondary | ICD-10-CM

## 2022-04-03 MED ORDER — GABAPENTIN 300 MG PO CAPS: 300.0000 mg | ORAL_CAPSULE | Freq: Every day | ORAL | 3 refills | Status: DC

## 2022-04-03 NOTE — Patient Instructions (Signed)
Start gabapentin 300mg  at bedtime  We will request labs from your primary care doctor and let you know if we need to check any additional labs  Return to clinic in 4 months

## 2022-04-04 ENCOUNTER — Telehealth: Payer: Self-pay | Admitting: Neurology

## 2022-04-04 NOTE — Telephone Encounter (Signed)
Called patient and left a message for a call back.  

## 2022-04-04 NOTE — Telephone Encounter (Signed)
Labs rec'd from PCP's office 10/15/2021:  TSH 1.39, vitamin B12 199  Please let pt know that I would like to recheck her vitamin B12 to be sure the level has improved, since low vitamin levels can cause the abnormal crawling sensation she is experiencing.  I would also like to check vitamin B1 and folate.  Thanks, DP

## 2022-04-08 NOTE — Telephone Encounter (Signed)
Called and informed patient of Dr. Serita Grit recommendations. Patient stated that she will have her PCP recheck her labs and have results sent over to Dr. Posey Pronto for review. She stated it is cheaper for her to have them done through her PCP.

## 2022-04-08 NOTE — Telephone Encounter (Signed)
Noted  

## 2022-05-18 ENCOUNTER — Encounter: Payer: Self-pay | Admitting: *Deleted

## 2022-05-18 ENCOUNTER — Other Ambulatory Visit: Payer: Self-pay

## 2022-05-18 ENCOUNTER — Ambulatory Visit
Admission: EM | Admit: 2022-05-18 | Discharge: 2022-05-18 | Disposition: A | Discharge status: Home / Self Care | Payer: BC Managed Care – PPO | Attending: Urgent Care | Admitting: Urgent Care

## 2022-05-18 DIAGNOSIS — Z1152 Encounter for screening for COVID-19: Secondary | ICD-10-CM | POA: Insufficient documentation

## 2022-05-18 DIAGNOSIS — B349 Viral infection, unspecified: Secondary | ICD-10-CM | POA: Insufficient documentation

## 2022-05-18 HISTORY — DX: Paresthesia of skin: R20.2

## 2022-05-18 LAB — RESP PANEL BY RT-PCR (FLU A&B, COVID) ARPGX2
Influenza A by PCR: NEGATIVE
Influenza B by PCR: NEGATIVE
SARS Coronavirus 2 by RT PCR: NEGATIVE

## 2022-05-18 MED ORDER — IBUPROFEN 600 MG PO TABS: 600.0000 mg | ORAL_TABLET | Freq: Four times a day (QID) | ORAL | 0 refills | Status: DC | PRN

## 2022-05-18 MED ORDER — PROMETHAZINE-DM 6.25-15 MG/5ML PO SYRP: 2.5000 mL | ORAL_SOLUTION | Freq: Three times a day (TID) | ORAL | 0 refills | Status: DC | PRN

## 2022-05-18 MED ORDER — CETIRIZINE HCL 10 MG PO TABS: 10.0000 mg | ORAL_TABLET | Freq: Every day | ORAL | 0 refills | Status: DC

## 2022-05-18 MED ORDER — BENZONATATE 100 MG PO CAPS: 100.0000 mg | ORAL_CAPSULE | Freq: Three times a day (TID) | ORAL | 0 refills | Status: DC | PRN

## 2022-05-18 MED ORDER — IPRATROPIUM BROMIDE 0.03 % NA SOLN: 2.0000 | Freq: Two times a day (BID) | NASAL | 0 refills | Status: DC

## 2022-05-18 NOTE — ED Triage Notes (Addendum)
C/O runny nose, nasal congestion, sore throat, nausea onset yesterday. No known fevers. Denies cough. Has been taking Dayquil, Nyquil Cold & Flu, Chloroseptic spray. States feels like when she had Covid 1 yr ago following same vacation to [place] she just arrived home from yesterday.

## 2022-05-18 NOTE — ED Provider Notes (Signed)
[facility]  Note:  This document was prepared using Systems analyst and may include unintentional dictation errors.  MRN: [id number] DOB: June 24, 1971  Subjective:   Pamela Ellis is a 51 y.o. female presenting for 1 day history of acute onset runny and stuffy nose, throat pain, postnasal drainage, nausea without vomiting.  Has been using multiple over-the-counter medications without any relief.  No chest pain, fever, shortness of breath, wheezing.  No history of respiratory disorders.  No history of hypertension.  Patient is not a smoker.  Patient is currently traveling, would like a note for when she returns.  No current facility-administered medications for this encounter.  Current Outpatient Medications:    cyanocobalamin (VITAMIN B12) 500 MCG tablet, Take 500 mcg by mouth daily., Disp: , Rfl:    Ergocalciferol (VITAMIN D2 PO), Take by mouth., Disp: , Rfl:    gabapentin (NEURONTIN) 300 MG capsule, Take 1 capsule (300 mg total) by mouth at bedtime., Disp: 30 capsule, Rfl: 3   acetaminophen (TYLENOL) 500 MG tablet, Take 1 tablet (500 mg total) by mouth every 6 (six) hours as needed. (Patient not taking: Reported on 04/03/2022), Disp: 30 tablet, Rfl: 0   dicyclomine (BENTYL) 20 MG tablet, Take 1 tablet (20 mg total) by mouth 3 (three) times daily as needed for spasms. (Patient not taking: Reported on 04/03/2022), Disp: 20 tablet, Rfl: 0   pantoprazole (PROTONIX) 40 MG tablet, Take 1 tablet (40 mg total) by mouth daily. (Patient not taking: Reported on 04/03/2022), Disp: 30 tablet, Rfl: 0   No Known Allergies  Past Medical History:  Diagnosis Date   GERD (gastroesophageal reflux disease)    Paresthesia      Past Surgical History:  Procedure Laterality Date   HERNIA REPAIR      Family History  Problem Relation Age of Onset   Dementia Mother    Hepatitis C Father     Social History   Tobacco Use   Smoking status: Former     Packs/day: 0.20    Types: Cigarettes   Smokeless tobacco: Never  Vaping Use   Vaping Use: Never used  Substance Use Topics   Alcohol use: Yes    Comment: occasionally   Drug use: No    ROS   Objective:   Vitals: BP (!) 170/87 (BP Location: Right Arm)   Pulse 67   Temp 98.1 F (36.7 C) (Oral)   Resp 16   LMP 04/20/2022 (Approximate)   SpO2 96%   BP recheck was 155/92.  Physical Exam Constitutional:      General: She is not in acute distress.    Appearance: Normal appearance. She is well-developed and normal weight. She is not ill-appearing, toxic-appearing or diaphoretic.  HENT:     Head: Normocephalic and atraumatic.     Right Ear: Tympanic membrane, ear canal and external ear normal. No drainage or tenderness. No middle ear effusion. There is no impacted cerumen. Tympanic membrane is not erythematous or bulging.     Left Ear: Tympanic membrane, ear canal and external ear normal. No drainage or tenderness.  No middle ear effusion. There is no impacted cerumen. Tympanic membrane is not erythematous or bulging.     Nose: Congestion present. No rhinorrhea.     Mouth/Throat:     Mouth: Mucous membranes are moist. No oral lesions.     Pharynx: No pharyngeal swelling, oropharyngeal exudate, posterior oropharyngeal erythema or uvula swelling.     Tonsils: No tonsillar exudate  or tonsillar abscesses.     Comments: Thick streaks of postnasal drainage overlying pharynx. Eyes:     General: No scleral icterus.       Right eye: No discharge.        Left eye: No discharge.     Extraocular Movements: Extraocular movements intact.     Right eye: Normal extraocular motion.     Left eye: Normal extraocular motion.     Conjunctiva/sclera: Conjunctivae normal.  Cardiovascular:     Rate and Rhythm: Normal rate and regular rhythm.     Heart sounds: Normal heart sounds. No murmur heard.    No friction rub. No gallop.  Pulmonary:     Effort: Pulmonary effort is normal. No respiratory  distress.     Breath sounds: No stridor. No wheezing, rhonchi or rales.  Chest:     Chest wall: No tenderness.  Musculoskeletal:     Cervical back: Normal range of motion and neck supple.  Lymphadenopathy:     Cervical: No cervical adenopathy.  Skin:    General: Skin is warm and dry.  Neurological:     General: No focal deficit present.     Mental Status: She is alert and oriented to person, place, and time.  Psychiatric:        Mood and Affect: Mood normal.        Behavior: Behavior normal.     Assessment and Plan :   PDMP not reviewed this encounter.  1. Acute viral syndrome     Does not meet [facility] criteria for strep testing.  Deferred imaging given clear cardiopulmonary exam, hemodynamically stable vital signs. Will manage for viral illness such as viral URI, viral syndrome, viral rhinitis, COVID-19. Recommended supportive care. Offered scripts for symptomatic relief. Testing is pending. Counseled patient on potential for adverse effects with medications prescribed/recommended today, ER and return-to-clinic precautions discussed, patient verbalized understanding.   Patient would like to take Paxlovid should she test positive.  This would be appropriate, no history of CKD, last GFR was greater than 60 this past year.   Wallis Bamberg, [place] 05/18/22 [id number]

## 2022-05-18 NOTE — Discharge Instructions (Signed)
We will notify you of your test results as they arrive and may take between about 24 hours.  I encourage you to sign up for MyChart if you have not already done so as this can be the easiest way for [name] to communicate results to you online or through a phone app.  Generally, we only contact you if it is a positive test result.  In the meantime, if you develop worsening symptoms including fever, chest pain, shortness of breath despite our current treatment plan then please report to the emergency room as this may be a sign of worsening status from possible viral infection.  Otherwise, we will manage this as a viral syndrome. For sore throat or cough try using a honey-based tea. Use 3 teaspoons of honey with juice squeezed from half lemon. Place shaved pieces of ginger into 1/2-1 cup of water and warm over stove top. Then mix the ingredients and repeat every 4 hours as needed. Please take Tylenol 500mg -650mg  every 6 hours for aches and pains, fevers. Hydrate very well with at least 2 liters of water. Eat light meals such as soups to replenish electrolytes and soft fruits, veggies. Start an antihistamine like Zyrtec for postnasal drainage, sinus congestion.  You can take this together with the nasal spray as needed for the same kind of congestion.  Use the cough medications as needed.

## 2022-07-19 ENCOUNTER — Encounter: Payer: Self-pay | Admitting: Family Medicine

## 2022-07-19 DIAGNOSIS — Z1231 Encounter for screening mammogram for malignant neoplasm of breast: Secondary | ICD-10-CM

## 2022-07-24 ENCOUNTER — Other Ambulatory Visit: Payer: Self-pay | Admitting: Family Medicine

## 2022-07-24 DIAGNOSIS — N644 Mastodynia: Secondary | ICD-10-CM

## 2022-08-07 ENCOUNTER — Ambulatory Visit: Payer: BC Managed Care – PPO | Admitting: Neurology

## 2022-08-30 ENCOUNTER — Ambulatory Visit
Admission: RE | Admit: 2022-08-30 | Discharge: 2022-08-30 | Disposition: A | Discharge status: Home / Self Care | Payer: BC Managed Care – PPO | Source: Ambulatory Visit | Attending: Family Medicine | Admitting: Family Medicine

## 2022-08-30 ENCOUNTER — Ambulatory Visit: Payer: BC Managed Care – PPO

## 2022-08-30 DIAGNOSIS — N644 Mastodynia: Secondary | ICD-10-CM

## 2022-09-03 ENCOUNTER — Ambulatory Visit: Payer: BC Managed Care – PPO | Admitting: Neurology

## 2023-04-01 ENCOUNTER — Other Ambulatory Visit (HOSPITAL_BASED_OUTPATIENT_CLINIC_OR_DEPARTMENT_OTHER): Payer: Self-pay

## 2023-04-01 DIAGNOSIS — R5383 Other fatigue: Secondary | ICD-10-CM

## 2023-04-01 DIAGNOSIS — R0683 Snoring: Secondary | ICD-10-CM

## 2023-04-01 DIAGNOSIS — R454 Irritability and anger: Secondary | ICD-10-CM

## 2023-05-02 ENCOUNTER — Encounter (HOSPITAL_BASED_OUTPATIENT_CLINIC_OR_DEPARTMENT_OTHER): Payer: BC Managed Care – PPO | Admitting: Internal Medicine

## 2023-05-23 ENCOUNTER — Ambulatory Visit (HOSPITAL_BASED_OUTPATIENT_CLINIC_OR_DEPARTMENT_OTHER): Payer: BC Managed Care – PPO | Attending: Family Medicine | Admitting: Internal Medicine

## 2023-05-23 VITALS — Ht 61.0 in | Wt 195.0 lb

## 2023-05-23 DIAGNOSIS — R454 Irritability and anger: Secondary | ICD-10-CM | POA: Diagnosis not present

## 2023-05-23 DIAGNOSIS — G4733 Obstructive sleep apnea (adult) (pediatric): Secondary | ICD-10-CM

## 2023-05-23 DIAGNOSIS — R0683 Snoring: Secondary | ICD-10-CM | POA: Insufficient documentation

## 2023-05-23 DIAGNOSIS — R5383 Other fatigue: Secondary | ICD-10-CM | POA: Diagnosis not present

## 2023-05-31 DIAGNOSIS — R0683 Snoring: Secondary | ICD-10-CM | POA: Diagnosis not present

## 2023-05-31 NOTE — Procedures (Signed)
    Patient Name: Pamela Ellis, Pamela Ellis Date: 05/23/2023 Gender: Female D.O.B: April 03, 1971 Age (years): 52 Referring Provider: Leilani Able Height (inches): 61 Interpreting Physician: Jetty Duhamel MD, ABSM Weight (lbs): 195 RPSGT: Cherylann Parr BMI: 37 MRN: [id number] Neck Size: 15.00  CLINICAL INFORMATION Sleep Study Type: NPSG Indication for sleep study: Fatigue, Snoring, Witnesses Apnea / Gasping During Sleep Epworth Sleepiness Score: 4  SLEEP STUDY TECHNIQUE As per the AASM Manual for the Scoring of Sleep and Associated Events v2.3 (April 2016) with a hypopnea requiring 4% desaturations.  The channels recorded and monitored were frontal, central and occipital EEG, electrooculogram (EOG), submentalis EMG (chin), nasal and oral airflow, thoracic and abdominal wall motion, anterior tibialis EMG, snore microphone, electrocardiogram, and pulse oximetry.  MEDICATIONS Medications self-administered by patient taken the night of the study : none reported  SLEEP ARCHITECTURE The study was initiated at 10:01:27 PM and ended at 4:50:20 AM.  Sleep onset time was 14.6 minutes and the sleep efficiency was 81.9%. The total sleep time was 335 minutes.  Stage REM latency was 121.0 minutes.  The patient spent 4.6% of the night in stage N1 sleep, 80.6% in stage N2 sleep, 0.0% in stage N3 and 14.8% in REM.  Alpha intrusion was absent.  Supine sleep was 42.72%.  RESPIRATORY PARAMETERS The overall apnea/hypopnea index (AHI) was 9.7 per hour. There were 2 total apneas, including 2 obstructive, 0 central and 0 mixed apneas. There were 52 hypopneas and 0 RERAs.  The AHI during Stage REM sleep was 7.3 per hour.  AHI while supine was 11.3 per hour.  The mean oxygen saturation was 94.6%. The minimum SpO2 during sleep was 85.0%.  moderate snoring was noted during this study.  CARDIAC DATA The 2 lead EKG demonstrated sinus rhythm. The mean heart rate was 62.9 beats per minute. Other EKG findings  include: None.  LEG MOVEMENT DATA The total PLMS were 0 with a resulting PLMS index of 0.0. Associated arousal with leg movement index was 0.0 .  IMPRESSIONS - Mild obstructive sleep apnea occurred during this study (AHI = 9.7/h). - Mild oxygen desaturation was noted during this study (Min O2 = 85.0%, Mean 94.6%). - The patient snored with moderate snoring volume. - No cardiac abnormalities were noted during this study. - Clinically significant periodic limb movements did not occur during sleep. No significant associated arousals.  DIAGNOSIS - Obstructive Sleep Apnea (G47.33)  RECOMMENDATIONS - Treatment for mild OSA is guided by symptoms and co-morbidity. Conservative measures may include observation, weight loss, chin strap and sleep position off back. Other options may include CPAP, a fitted oral appliance, and ENT referral, based on clinical judgment. - Avoid alcohol, sedatives and other CNS depressants that may worsen sleep apnea and disrupt normal sleep architecture. - Sleep hygiene should be reviewed to assess factors that may improve sleep quality. - Weight management and regular exercise should be initiated or continued if appropriate.  [Electronically signed] 05/31/2023 01:02 PM  Jetty Duhamel MD, ABSM Diplomate, American Board of Sleep Medicine NPI: [id number]                         Jetty Duhamel Diplomate, American Board of Sleep Medicine  ELECTRONICALLY SIGNED ON:  05/31/2023, 12:57 PM [facility] PH: [phone]   FX: [phone] ACCREDITED BY THE AMERICAN ACADEMY OF SLEEP MEDICINE

## 2023-06-04 ENCOUNTER — Ambulatory Visit: Payer: BC Managed Care – PPO

## 2023-06-04 ENCOUNTER — Encounter: Payer: Self-pay | Admitting: Emergency Medicine

## 2023-06-04 ENCOUNTER — Ambulatory Visit
Admission: EM | Admit: 2023-06-04 | Discharge: 2023-06-04 | Disposition: A | Discharge status: Home / Self Care | Payer: BC Managed Care – PPO | Attending: Internal Medicine | Admitting: Internal Medicine

## 2023-06-04 DIAGNOSIS — R079 Chest pain, unspecified: Secondary | ICD-10-CM

## 2023-06-04 DIAGNOSIS — R0789 Other chest pain: Secondary | ICD-10-CM

## 2023-06-04 HISTORY — DX: Essential (primary) hypertension: I10

## 2023-06-04 HISTORY — DX: Anxiety disorder, unspecified: F41.9

## 2023-06-04 MED ORDER — CYCLOBENZAPRINE HCL 5 MG PO TABS: 5.0000 mg | ORAL_TABLET | Freq: Three times a day (TID) | ORAL | 0 refills | Status: DC | PRN

## 2023-06-04 MED ORDER — KETOROLAC TROMETHAMINE 60 MG/2ML IM SOLN
60.0000 mg | Freq: Once | INTRAMUSCULAR | Status: AC
  Administered 2023-06-04: 60 mg via INTRAMUSCULAR

## 2023-06-04 MED ORDER — NAPROXEN 500 MG PO TABS: 500.0000 mg | ORAL_TABLET | Freq: Two times a day (BID) | ORAL | 0 refills | Status: DC

## 2023-06-04 NOTE — ED Provider Notes (Signed)
[facility]  Note:  This document was prepared using Conservation officer, historic buildings and may include unintentional dictation errors.  MRN: [id number] DOB: 05/25/1971  Subjective:   Pamela Ellis is a 52 y.o. female presenting for acute onset since this morning of moderate to severe right sided internal chest pain aggravated by movement, taking a deep breath. Has a hard time moving her arms generally due to the pain it elicits. No fever, shortness of breath, wheezing, coughing.  No history of clotting disorders.  No recent surgeries or major trauma.  No known malignancies.  Patient is not a smoker, no hormonal contraception.  No current facility-administered medications for this encounter.  Current Outpatient Medications:    amLODipine (NORVASC) 2.5 MG tablet, Take 2.5 mg by mouth daily., Disp: , Rfl:    escitalopram (LEXAPRO) 10 MG tablet, Take 10 mg by mouth daily., Disp: , Rfl:    hydrochlorothiazide (HYDRODIURIL) 25 MG tablet, Take 25 mg by mouth daily., Disp: , Rfl:    omeprazole (PRILOSEC) 40 MG capsule, Take 40 mg by mouth daily., Disp: , Rfl:    pregabalin (LYRICA) 75 MG capsule, Take 75 mg by mouth 2 (two) times daily., Disp: , Rfl:    acetaminophen (TYLENOL) 500 MG tablet, Take 1 tablet (500 mg total) by mouth every 6 (six) hours as needed. (Patient not taking: Reported on 04/03/2022), Disp: 30 tablet, Rfl: 0   benzonatate (TESSALON) 100 MG capsule, Take 1 capsule (100 mg total) by mouth 3 (three) times daily as needed for cough., Disp: 30 capsule, Rfl: 0   cetirizine (ZYRTEC ALLERGY) 10 MG tablet, Take 1 tablet (10 mg total) by mouth daily., Disp: 30 tablet, Rfl: 0   cyanocobalamin (VITAMIN B12) 500 MCG tablet, Take 500 mcg by mouth daily., Disp: , Rfl:    dicyclomine (BENTYL) 20 MG tablet, Take 1 tablet (20 mg total) by mouth 3 (three) times daily as needed for spasms. (Patient not taking: Reported on 04/03/2022), Disp: 20 tablet, Rfl: 0    Ergocalciferol (VITAMIN D2 PO), Take by mouth., Disp: , Rfl:    gabapentin (NEURONTIN) 300 MG capsule, Take 1 capsule (300 mg total) by mouth at bedtime., Disp: 30 capsule, Rfl: 3   ibuprofen (ADVIL) 600 MG tablet, Take 1 tablet (600 mg total) by mouth every 6 (six) hours as needed., Disp: 30 tablet, Rfl: 0   ipratropium (ATROVENT) 0.03 % nasal spray, Place 2 sprays into both nostrils 2 (two) times daily., Disp: 30 mL, Rfl: 0   pantoprazole (PROTONIX) 40 MG tablet, Take 1 tablet (40 mg total) by mouth daily. (Patient not taking: Reported on 04/03/2022), Disp: 30 tablet, Rfl: 0   promethazine-dextromethorphan (PROMETHAZINE-DM) 6.25-15 MG/5ML syrup, Take 2.5 mLs by mouth 3 (three) times daily as needed for cough., Disp: 100 mL, Rfl: 0   No Known Allergies  Past Medical History:  Diagnosis Date   Anxiety    GERD (gastroesophageal reflux disease)    Hypertension    Paresthesia      Past Surgical History:  Procedure Laterality Date   HERNIA REPAIR      Family History  Problem Relation Age of Onset   Dementia Mother    Hepatitis C Father     Social History   Tobacco Use   Smoking status: Former    Current packs/day: 0.20    Types: Cigarettes   Smokeless tobacco: Never  Vaping Use   Vaping status: Never Used  Substance Use Topics   Alcohol use:  Yes    Comment: occasionally   Drug use: No    ROS   Objective:   Vitals: BP 128/83 (BP Location: Right Arm)   Pulse 65   Temp 98.6 F (37 C) (Oral)   Resp 17   SpO2 97%   Physical Exam Constitutional:      General: She is not in acute distress.    Appearance: Normal appearance. She is well-developed. She is not ill-appearing, toxic-appearing or diaphoretic.  HENT:     Head: Normocephalic and atraumatic.     Nose: Nose normal.     Mouth/Throat:     Mouth: Mucous membranes are moist.  Eyes:     General: No scleral icterus.       Right eye: No discharge.        Left eye: No discharge.     Extraocular Movements:  Extraocular movements intact.  Cardiovascular:     Rate and Rhythm: Normal rate and regular rhythm.     Heart sounds: Normal heart sounds. No murmur heard.    No friction rub. No gallop.  Pulmonary:     Effort: Pulmonary effort is normal. No respiratory distress.     Breath sounds: No stridor. No wheezing, rhonchi or rales.  Chest:     Chest wall: No tenderness.  Skin:    General: Skin is warm and dry.  Neurological:     General: No focal deficit present.     Mental Status: She is alert and oriented to person, place, and time.  Psychiatric:        Mood and Affect: Mood normal.        Behavior: Behavior normal.     DG Chest 2 View  Result Date: 06/04/2023 CLINICAL DATA:  Right-sided chest pain, worse with breathing and movement. EXAM: CHEST - 2 VIEW COMPARISON:  Chest radiographs 12/24/2021 FINDINGS: The cardiomediastinal silhouette is unchanged with normal heart size. The lungs are borderline hyperinflated with slight prominence of the interstitial markings. No overt pulmonary edema, confluent airspace opacity, pleural effusion, or pneumothorax is identified. No acute osseous abnormality is seen. IMPRESSION: No active cardiopulmonary disease. Electronically Signed   By: Sebastian Ache M.D.   On: 06/04/2023 08:54   Sleep Study Documents  Result Date: 06/03/2023 Ordered by an unspecified provider.    IM Toradol 60 mg administered in clinic.  Assessment and Plan :   PDMP not reviewed this encounter.  1. Right-sided chest pain    Patient has a low PERC score, low heart score.  IM Toradol as above.  Recommended muscle relaxant and naproxen starting tonight.  Advised that she present to the emergency room in the next few hours if she experiences no relief or starts having worsening symptoms.  Counseled patient on potential for adverse effects with medications prescribed today, patient verbalized understanding.    Wallis Bamberg, [place] 06/04/23 1610

## 2023-06-04 NOTE — ED Triage Notes (Addendum)
Pt c/o right lateral side chest pain that started when got in shower this morning. Worse with breathing and movement. Pt reports has hard time getting dressed due to pain when moving.

## 2023-08-25 ENCOUNTER — Ambulatory Visit
Admission: RE | Admit: 2023-08-25 | Discharge: 2023-08-25 | Disposition: A | Discharge status: Home / Self Care | Payer: 59 | Source: Ambulatory Visit | Attending: Family Medicine | Admitting: Family Medicine

## 2023-08-25 ENCOUNTER — Other Ambulatory Visit: Payer: Self-pay | Admitting: Family Medicine

## 2023-08-25 DIAGNOSIS — M79671 Pain in right foot: Secondary | ICD-10-CM

## 2023-08-27 IMAGING — MR MR CERVICAL SPINE W/O CM
4 of 6 series · 30 of 48 positions shown · non-contrast
Comparison: Cervical spine radiographs 06/16/2015, two-view chest
radiograph 12/24/2021

CLINICAL DATA: Neck pain, paresthesias including in the thoracic
region in the right chest and low back pain

EXAM:
MRI CERVICAL, THORACIC AND LUMBAR SPINE WITHOUT CONTRAST
TECHNIQUE: Multiplanar and multiecho pulse sequences of the cervical spine, to
include the craniocervical junction and cervicothoracic junction,
and thoracic and lumbar spine, were obtained without intravenous
contrast.

[Series 17: T1 · sagittal · 3.0mm · 0.69mm/px · 5 of 15 slices shown (1 of 2)]
[im 1/15]
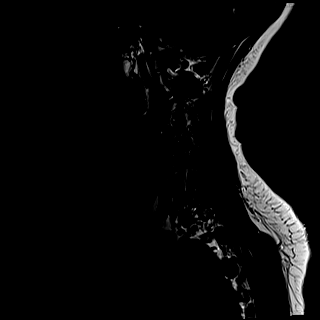
[im 4/15]
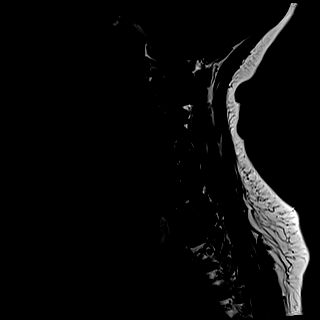
[im 8/15]
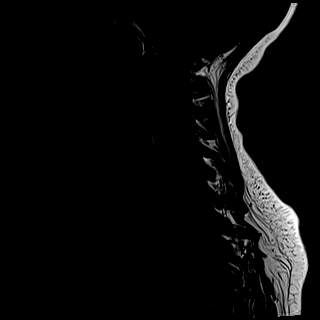
[im 11/15]
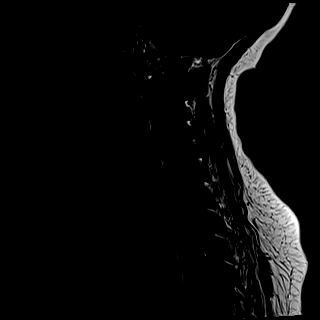
[im 15/15]
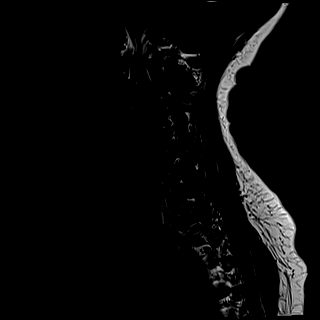

[Series 18: T2 · sagittal · 3.0mm · 0.69mm/px · 5 of 15 slices shown (1 of 2)]
[im 1/15]
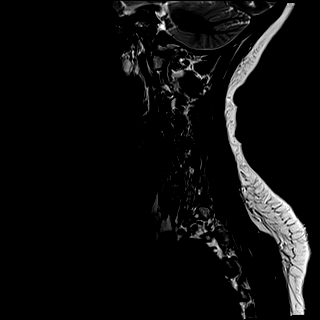
[im 4/15]
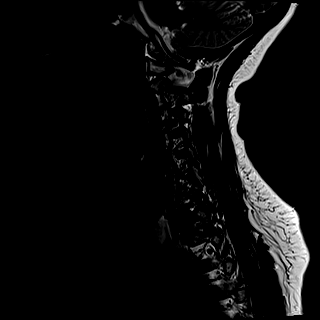
[im 8/15]
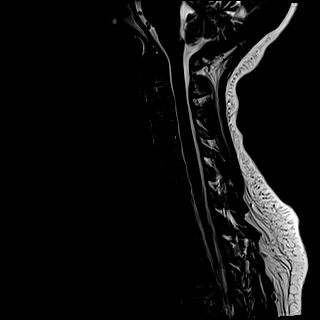
[im 11/15]
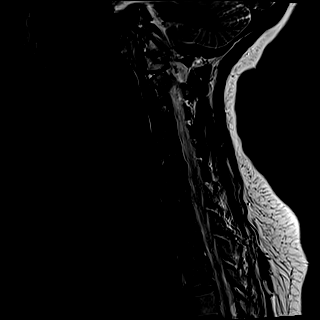
[im 15/15]
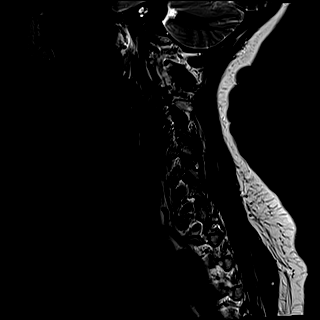

[Series 19: T2 · axial · 3.0mm · 0.70mm/px · z∈[-40,+65]mm · 11 of 32 slices shown (2 of 2)]
[im 1/32]
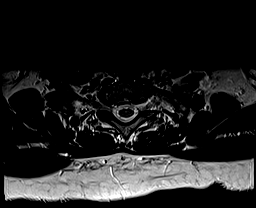
[im 4/32]
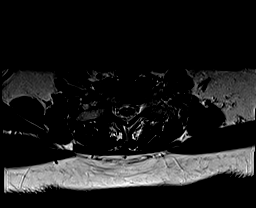
[im 7/32]
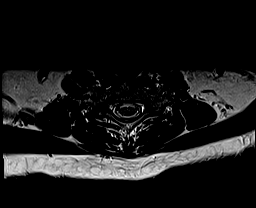
[im 10/32]
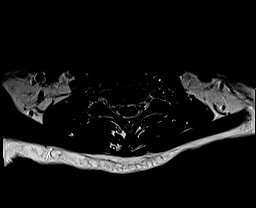
[im 13/32]
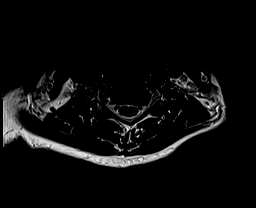
[im 16/32]
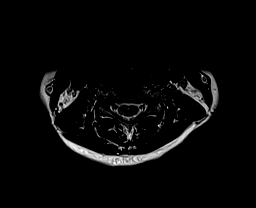
[im 19/32]
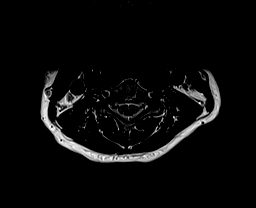
[im 22/32]
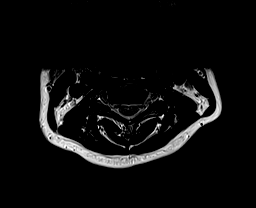
[im 25/32]
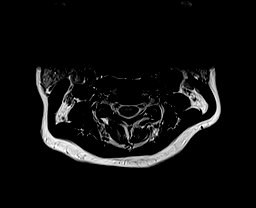
[im 28/32]
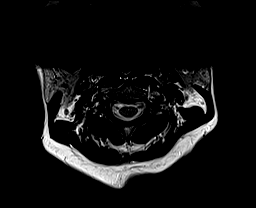
[im 32/32]
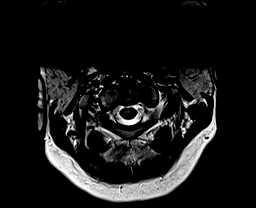

[Series 21: T1 · axial · 3.0mm · 0.35mm/px · z∈[-45,+47]mm · 9 of 32 slices shown (2 of 2)]
[im 1/32]
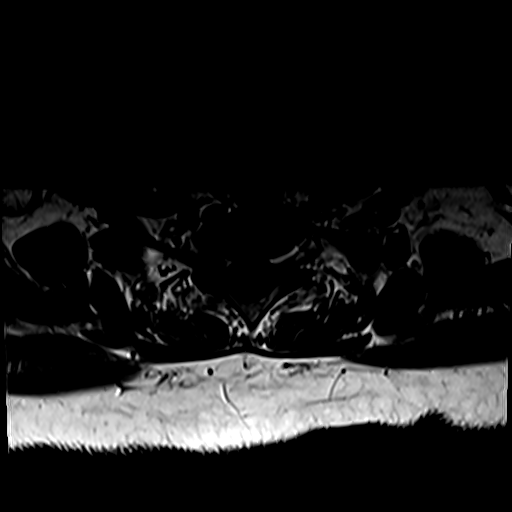
[im 4/32]
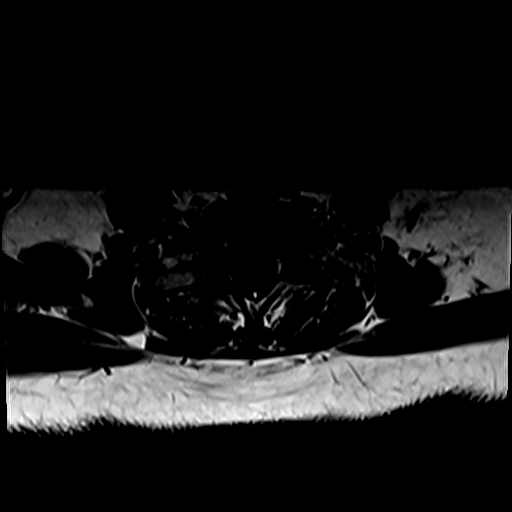
[im 7/32]
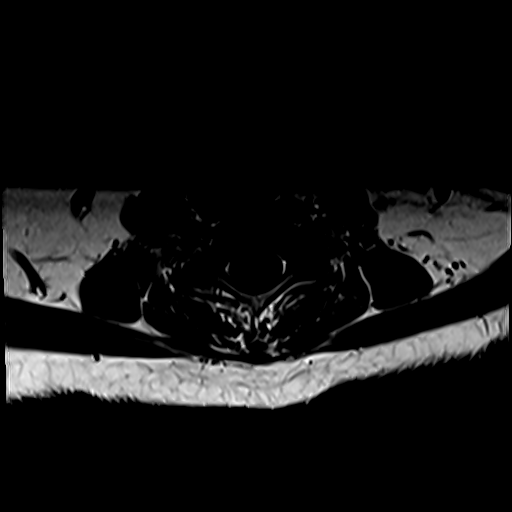
[im 10/32]
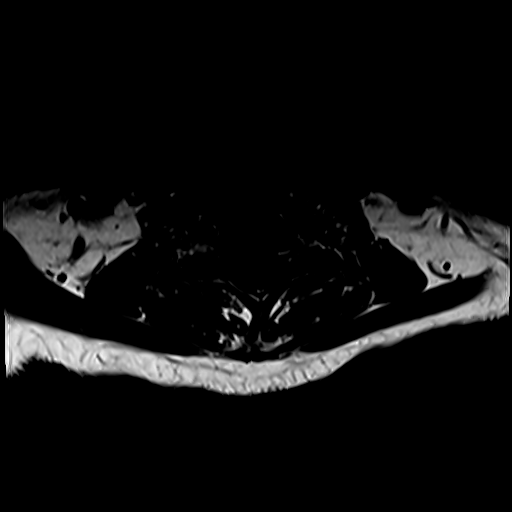
[im 13/32]
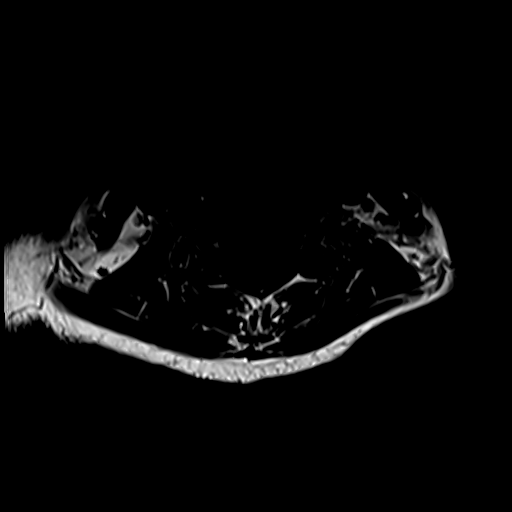
[im 16/32]
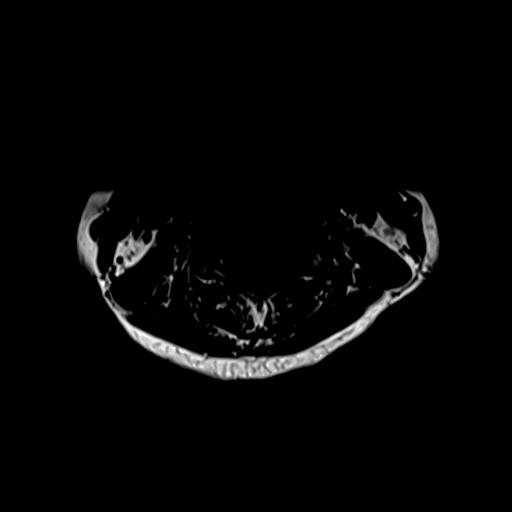
[im 19/32]
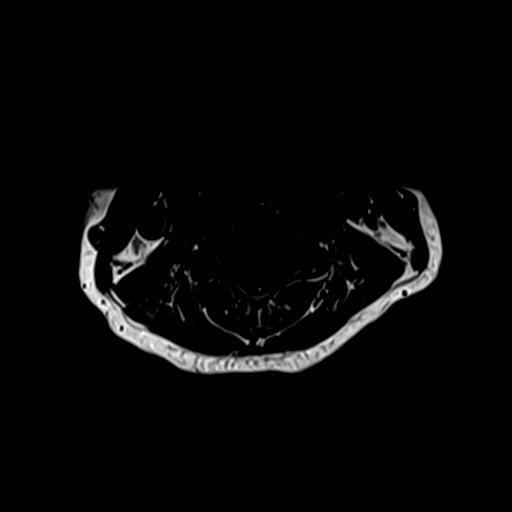
[im 22/32]
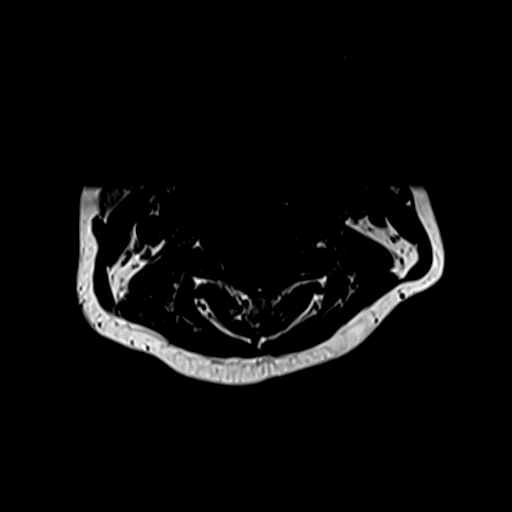
[im 28/32]
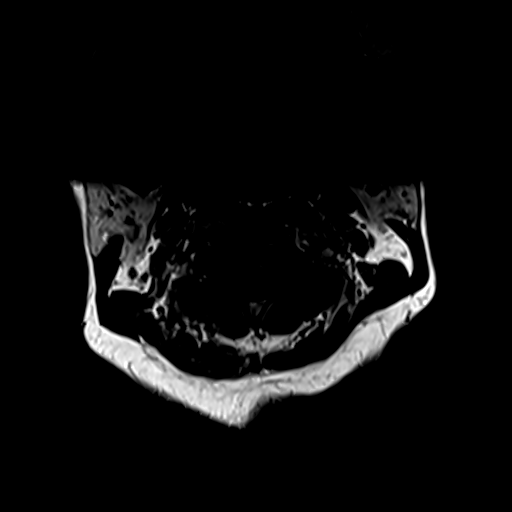

[30 of 48 positions shown; findings below may reference images not displayed]

FINDINGS: MRI CERVICAL SPINE FINDINGS

Alignment: There is straightening of the normal cervical lordosis.
There is no antero or retrolisthesis.

Vertebrae: Vertebral body heights are preserved. Background marrow
signal is normal. There is no suspicious marrow signal abnormality
or marrow edema.

Cord: Normal in signal and morphology.

Posterior Fossa, vertebral arteries, paraspinal tissues: The imaged
posterior fossa is unremarkable. The vertebral artery flow voids are
normal. The paraspinal soft tissues are unremarkable.

Disc levels:

There is mild disc desiccation without significant loss of height at
C4-C5 through C6-C7.

C2-C3: No significant spinal canal or neural foraminal stenosis

C3-C4: No significant spinal canal or neural foraminal stenosis

C4-C5: Minimal degenerative endplate change without significant
spinal canal or neural foraminal stenosis

C5-C6: Minimal degenerative endplate change and facet arthropathy
without significant spinal canal or neural foraminal stenosis.

C6-C7: Minimal degenerative endplate change and facet arthropathy
without significant spinal canal or neural foraminal stenosis

C7-T1: No significant spinal canal or neural foraminal stenosis.

MRI THORACIC SPINE FINDINGS

Alignment:  Normal.

Vertebrae: Vertebral body heights are preserved. Background marrow
signal is normal. There is no suspicious marrow signal abnormality
or marrow edema.

Cord:  Normal in signal and morphology.

Paraspinal and other soft tissues: Unremarkable.

Disc levels:

There is mild disc desiccation and narrowing at T7-T8. There is a
small disc protrusion at this level without significant spinal canal
or neural foraminal stenosis.

The other disc heights are preserved. There is no other significant
spinal canal or neural foraminal stenosis. There is no evidence of
cord or nerve root impingement.

MRI LUMBAR SPINE FINDINGS

Segmentation: Standard; the lowest formed disc space is designated
L5-S1.

Alignment:  Normal.

Vertebrae: Vertebral body heights are preserved. Background marrow
signal is normal. There is degenerative endplate marrow signal
abnormality at L5-S1 with faint degenerative edema posteriorly.
There is no suspicious marrow signal abnormality or other marrow
edema.

Conus medullaris and cauda equina: Conus extends to the L1-L2 level.
Conus and cauda equina appear normal.

Paraspinal and other soft tissues: The paraspinal soft tissues are
unremarkable. The uterus is enlarged and heterogeneous with multiple
rounded lesions likely reflecting fibroids.

Disc levels:

T12-L1: No significant spinal canal or neural foraminal stenosis

L1-L2: No significant spinal canal or neural foraminal stenosis

L2-L3: Mild facet arthropathy without significant spinal canal or
neural foraminal stenosis.

L3-L4: Mild facet arthropathy without significant spinal canal or
neural foraminal stenosis

L4-L5: There is advanced disc desiccation and narrowing with a
diffuse disc bulge and bilateral facet arthropathy resulting in
narrowing of the right subarticular zone with potential irritation
of the traversing L5 nerve root and mild right and no significant
left neural foraminal stenosis

L5-S1: There is advanced disc desiccation and narrowing with a
diffuse disc bulge and superimposed central/right subarticular zone
protrusion resulting in narrowing of the right subarticular zone
with potential irritation of the traversing S1 nerve root and
moderate bilateral neural foraminal stenosis.
IMPRESSION: 1. Advanced disc degeneration at L4-L5 and L5-S1 with associated
bulges resulting in narrowing of the right subarticular zones at
both levels and potential impingement of the traversing L5 and S1
nerve roots. Mild right neural foraminal stenosis at L4-L5, and
moderate bilateral neural foraminal stenosis at L5-S1.
2. Essentially normal cervical spine MRI with minimal degenerative
changes without significant spinal canal or neural foraminal
stenosis.
3. Small disc protrusion at T7-T8 without significant spinal canal
or neural foraminal stenosis. Otherwise, unremarkable thoracic spine
MRI without significant spinal canal or neural foraminal stenosis,
and no evidence of cord or nerve root compression.
4. Enlarged fibroid uterus.

## 2023-11-20 ENCOUNTER — Encounter: Payer: Self-pay | Admitting: Internal Medicine

## 2023-11-20 ENCOUNTER — Ambulatory Visit: Admitting: Internal Medicine

## 2023-11-20 VITALS — BP 124/90 | HR 75 | Temp 98.5°F | Ht 64.0 in | Wt 221.6 lb

## 2023-11-20 DIAGNOSIS — E669 Obesity, unspecified: Secondary | ICD-10-CM | POA: Diagnosis not present

## 2023-11-20 DIAGNOSIS — Z6838 Body mass index (BMI) 38.0-38.9, adult: Secondary | ICD-10-CM

## 2023-11-20 DIAGNOSIS — G4733 Obstructive sleep apnea (adult) (pediatric): Secondary | ICD-10-CM | POA: Diagnosis not present

## 2023-11-20 DIAGNOSIS — R5381 Other malaise: Secondary | ICD-10-CM

## 2023-11-20 DIAGNOSIS — Z87891 Personal history of nicotine dependence: Secondary | ICD-10-CM

## 2023-11-20 NOTE — Progress Notes (Signed)
 Name: Pamela Ellis MRN: [id number] DOB: 1971-01-28    CHIEF COMPLAINT:  Assessment of sleep apnea   HISTORY OF PRESENT ILLNESS: Patient is seen today for problems and issues with sleep related to excessive daytime sleepiness Patient  has been having sleep problems for many years Patient has been having excessive daytime sleepiness for a long time Patient has been having extreme fatigue and tiredness, lack of energy +  very Loud snoring every night + Nonrefreshing sleep  Discussed sleep data and reviewed with patient.  Encouraged proper weight management.    Patient underwent split-night sleep study in November 2024 Results reviewed with patient in detail Patient diagnosed with mild OSA with AHI approximately 10 CPAP was not initiated  Patient has extensive amounts of fatigue tiredness Restless sleep Significant snoring issues Witnessed apneas Not refreshed sleep Recommend starting CPAP therapy  No exacerbation at this time No evidence of heart failure at this time No evidence or signs of infection at this time No respiratory distress No fevers, chills, nausea, vomiting, diarrhea No evidence of lower extremity edema No evidence hemoptysis   PAST MEDICAL HISTORY :   has a past medical history of Anxiety, GERD (gastroesophageal reflux disease), Hypertension, and Paresthesia.  has a past surgical history that includes Hernia repair. Prior to Admission medications   Medication Sig Start Date End Date Taking? Authorizing Provider  acetaminophen  (TYLENOL ) 500 MG tablet Take 1 tablet (500 mg total) by mouth every 6 (six) hours as needed. Patient not taking: Reported on 04/03/2022 06/16/15   Rose, Kayla, PA-C  amLODipine (NORVASC) 2.5 MG tablet Take 2.5 mg by mouth daily. 05/15/23   [provider]  benzonatate  (TESSALON ) 100 MG capsule Take 1 capsule (100 mg total) by mouth 3 (three) times daily as needed for cough. 05/18/22   Adolph Hoop, PA-C  cetirizine   (ZYRTEC  ALLERGY) 10 MG tablet Take 1 tablet (10 mg total) by mouth daily. 05/18/22   Adolph Hoop, PA-C  cyanocobalamin (VITAMIN B12) 500 MCG tablet Take 500 mcg by mouth daily.    [provider]  cyclobenzaprine  (FLEXERIL ) 5 MG tablet Take 1 tablet (5 mg total) by mouth 3 (three) times daily as needed for muscle spasms. 06/04/23   Adolph Hoop, PA-C  dicyclomine  (BENTYL ) 20 MG tablet Take 1 tablet (20 mg total) by mouth 3 (three) times daily as needed for spasms. Patient not taking: Reported on 04/03/2022 12/24/21   Long, Joshua G, MD  Ergocalciferol (VITAMIN D2 PO) Take by mouth.    [provider]  escitalopram (LEXAPRO) 10 MG tablet Take 10 mg by mouth daily. 06/03/23   [provider]  gabapentin  (NEURONTIN ) 300 MG capsule Take 1 capsule (300 mg total) by mouth at bedtime. 04/03/22   Patel, Donika K, DO  hydrochlorothiazide (HYDRODIURIL) 25 MG tablet Take 25 mg by mouth daily. 02/11/23   [provider]  ibuprofen  (ADVIL ) 600 MG tablet Take 1 tablet (600 mg total) by mouth every 6 (six) hours as needed. 05/18/22   Adolph Hoop, PA-C  ipratropium (ATROVENT ) 0.03 % nasal spray Place 2 sprays into both nostrils 2 (two) times daily. 05/18/22   Adolph Hoop, PA-C  naproxen  (NAPROSYN ) 500 MG tablet Take 1 tablet (500 mg total) by mouth 2 (two) times daily with a meal. 06/04/23   Adolph Hoop, PA-C  omeprazole (PRILOSEC) 40 MG capsule Take 40 mg by mouth daily. 03/16/23   [provider]  pantoprazole  (PROTONIX ) 40 MG tablet Take 1 tablet (40 mg total) by mouth  daily. Patient not taking: Reported on 04/03/2022 12/24/21   Long, Joshua G, MD  pregabalin (LYRICA) 75 MG capsule Take 75 mg by mouth 2 (two) times daily. 05/18/23   [provider]  promethazine -dextromethorphan (PROMETHAZINE -DM) 6.25-15 MG/5ML syrup Take 2.5 mLs by mouth 3 (three) times daily as needed for cough. 05/18/22   Adolph Hoop, PA-C   No Known Allergies  FAMILY HISTORY:  family history  includes Dementia in her mother; Hepatitis C in her father. SOCIAL HISTORY:  reports that she has quit smoking. Her smoking use included cigarettes. She has never used smokeless tobacco. She reports current alcohol use. She reports that she does not use drugs.  BP (!) 124/90 (BP Location: Right Arm, Patient Position: Sitting, Cuff Size: Large)   Pulse 75   Temp 98.5 F (36.9 C) (Oral)   Ht 5\' 4"  (1.626 m)   Wt 221 lb 9.6 oz (100.5 kg)   SpO2 98%   BMI 38.04 kg/m     Review of Systems: Gen:  Denies  fever, sweats, chills weight loss  HEENT: Denies blurred vision, double vision, ear pain, eye pain, hearing loss, nose bleeds, sore throat Cardiac:  No dizziness, chest pain or heaviness, chest tightness,edema, No JVD Resp:   No cough, -sputum production, -shortness of breath,-wheezing, -hemoptysis,  Other:  All other systems negative   Physical Examination:   General Appearance: No distress  EYES PERRLA, EOM intact.   NECK Supple, No JVD Pulmonary: normal breath sounds, No wheezing.  CardiovascularNormal S1,S2.  No m/r/g.   Abdomen: Benign, Soft, non-tender. Neurology UE/LE 5/5 strength, no focal deficits Ext pulses intact, cap refill intact ALL OTHER ROS ARE NEGATIVE  CBC    Component Value Date/Time   WBC 6.0 12/24/2021 1326   RBC 4.80 12/24/2021 1326   HGB 13.1 12/24/2021 1326   HCT 40.6 12/24/2021 1326   PLT 310 12/24/2021 1326   MCV 84.6 12/24/2021 1326   MCH 27.3 12/24/2021 1326   MCHC 32.3 12/24/2021 1326   RDW 13.3 12/24/2021 1326      Latest Ref Rng & Units 12/24/2021    1:26 PM  BMP  Glucose 70 - 99 mg/dL 409   BUN 6 - 20 mg/dL 8   Creatinine 8.11 - 9.14 mg/dL 7.82   Sodium 956 - 213 mmol/L 138   Potassium 3.5 - 5.1 mmol/L 4.1   Chloride 98 - 111 mmol/L 106   CO2 22 - 32 mmol/L 25   Calcium 8.9 - 10.3 mg/dL 9.3      ASSESSMENT AND PLAN SYNOPSIS  53 year old pleasant female patient with signs and symptoms of excessive daytime sleepiness with  underlying diagnosis of obstructive sleep apnea in the setting of obesity and deconditioned state with AHI of 10 with significant compromise in her sleep with restless sleep excessive daytime sleepiness and fatigue lack of energy  Referral to DME company  RESMED AIR-TOUCH FIT F30i MASK START AUTO CPAP 4-10 cm h20  Obesity -recommend significant weight loss -recommend changing diet  Deconditioned state -Recommend increased daily activity and exercise   CURRENT MEDICATIONS REVIEWED AT LENGTH WITH PATIENT TODAY   Patient  satisfied with Plan of action and management. All questions answered  Follow up  3 months   I spent a total of  60 minutes reviewing chart data, face-to-face evaluation with the patient, counseling and coordination of care as detailed above.    Lady Pier, M.D.  [facility]  Medical Director [facility] Medical Director  [facility] Cardio-Pulmonary Department

## 2023-11-20 NOTE — Patient Instructions (Signed)
 Referral to DME company RESMED AIR-TOUCH FIT F30i MASK full face mask START AUTO CPAP 4-10 cm h20  Recommend weight loss  Avoid Allergens and Irritants Avoid secondhand smoke Avoid SICK contacts Recommend  Masking  when appropriate Recommend Keep up-to-date with vaccinations

## 2023-11-22 ENCOUNTER — Other Ambulatory Visit: Payer: Self-pay

## 2023-11-22 ENCOUNTER — Ambulatory Visit
Admission: EM | Admit: 2023-11-22 | Discharge: 2023-11-22 | Disposition: A | Discharge status: Home / Self Care | Attending: Family Medicine | Admitting: Family Medicine

## 2023-11-22 DIAGNOSIS — S76012A Strain of muscle, fascia and tendon of left hip, initial encounter: Secondary | ICD-10-CM

## 2023-11-22 DIAGNOSIS — S76011A Strain of muscle, fascia and tendon of right hip, initial encounter: Secondary | ICD-10-CM | POA: Diagnosis not present

## 2023-11-22 MED ORDER — CYCLOBENZAPRINE HCL 10 MG PO TABS: 10.0000 mg | ORAL_TABLET | Freq: Two times a day (BID) | ORAL | 0 refills | Status: AC | PRN

## 2023-11-22 MED ORDER — PREDNISONE 10 MG (21) PO TBPK: ORAL_TABLET | Freq: Every day | ORAL | 0 refills | Status: DC

## 2023-11-22 MED ORDER — KETOROLAC TROMETHAMINE 30 MG/ML IJ SOLN
30.0000 mg | Freq: Once | INTRAMUSCULAR | Status: AC
  Administered 2023-11-22: 30 mg via INTRAMUSCULAR

## 2023-11-22 NOTE — Discharge Instructions (Addendum)
 You were given a Toradol  injection in clinic today. Do not take any over the counter NSAID's such as Advil , ibuprofen , Aleve , or naproxen  for 24 hours. You may take tylenol  if needed.  Start Flexeril  today.  Please note this medication will make you drowsy.  Do not drink alcohol or drive while on this medication.  Start prednisone taper as prescribed.  Heat and rest to the affected muscles.  Please follow-up with your PCP if your symptoms do not improve.  Please go to the ER if you develop any worsening symptoms.  Hope you feel better soon!

## 2023-11-22 NOTE — ED Triage Notes (Signed)
 Pt c/o pain in upper glutesx2d. Pt states she put a heavy box down and the next morning is when she started having the pain. Pt denies numbness or tingling

## 2023-11-22 NOTE — ED Provider Notes (Signed)
 [facility]    CSN: [id number] Arrival date & time: 11/22/23  1308      History   Chief Complaint No chief complaint on file.   HPI Pamela Ellis is a 53 y.o. female who presents for buttock pain.  Patient reports 2 days ago she was loading a heavy box of books.  States she felt nothing the time but when she woke the next morning she had pain to the top of her bilateral buttock muscles.  Left is greater than right.  Pain is persistent and worse with movement.  Denies any low back pain.  No numbness tingling weakness of her lower extremities, no bowel or bladder incontinence, no saddle paresthesia.  No injury such as fall.  Denies history of injuries or surgeries to these affected areas.  She took some naproxen  yesterday with minimal improvement.  No other concerns at this time.  HPI  Past Medical History:  Diagnosis Date   Anxiety    GERD (gastroesophageal reflux disease)    Hypertension    Paresthesia     Patient Active Problem List   Diagnosis Date Noted   Paresthesias 12/26/2021    Past Surgical History:  Procedure Laterality Date   HERNIA REPAIR      OB History   No obstetric history on file.      Home Medications    Prior to Admission medications   Medication Sig Start Date End Date Taking? Authorizing Provider  cyclobenzaprine  (FLEXERIL ) 10 MG tablet Take 1 tablet (10 mg total) by mouth 2 (two) times daily as needed for muscle spasms. 11/22/23  Yes Melia Hopes, Jodi R, NP  predniSONE (STERAPRED UNI-PAK 21 TAB) 10 MG (21) TBPK tablet Take by mouth daily. Take 6 tabs by mouth daily  for 1 day, then 5 tabs for 1 day, then 4 tabs for 1 day, then 3 tabs for 1 day, 2 tabs for 1 day, then 1 tab by mouth daily for 1 days 11/22/23  Yes Mesiah Manzo, Jodi R, NP  amLODipine (NORVASC) 2.5 MG tablet Take 2.5 mg by mouth daily. 05/15/23   [provider]  DUAVEE 0.45-20 MG TABS  09/20/23   [provider]  omeprazole (PRILOSEC) 40 MG capsule Take 40 mg by mouth  daily. 03/16/23   [provider]    Family History Family History  Problem Relation Age of Onset   Dementia Mother    Hepatitis C Father     Social History Social History   Tobacco Use   Smoking status: Former    Current packs/day: 0.20    Types: Cigarettes   Smokeless tobacco: Never  Vaping Use   Vaping status: Never Used  Substance Use Topics   Alcohol use: Yes    Comment: occasionally   Drug use: No     Allergies   Patient has no known allergies.   Review of Systems Review of Systems  Musculoskeletal:        Bilateral buttock pain     Physical Exam Triage Vital Signs ED Triage Vitals  Encounter Vitals Group     BP 11/22/23 0943 130/83     Systolic BP Percentile --      Diastolic BP Percentile --      Pulse Rate 11/22/23 0943 76     Resp 11/22/23 0943 16     Temp 11/22/23 0943 97.7 F (36.5 C)     Temp Source 11/22/23 0943 Oral     SpO2 11/22/23 0943 95 %  Weight --      Height --      Head Circumference --      Peak Flow --      Pain Score 11/22/23 0941 2     Pain Loc --      Pain Education --      Exclude from Growth Chart --    No data found.  Updated Vital Signs BP 130/83   Pulse 76   Temp 97.7 F (36.5 C) (Oral)   Resp 16   SpO2 95%   Visual Acuity Right Eye Distance:   Left Eye Distance:   Bilateral Distance:    Right Eye Near:   Left Eye Near:    Bilateral Near:     Physical Exam Vitals and nursing note reviewed.  Constitutional:      Appearance: Normal appearance.  HENT:     Head: Normocephalic and atraumatic.  Eyes:     Pupils: Pupils are equal, round, and reactive to light.  Cardiovascular:     Rate and Rhythm: Normal rate.  Pulmonary:     Effort: Pulmonary effort is normal.  Musculoskeletal:     Thoracic back: Normal.     Lumbar back: Normal. No tenderness or bony tenderness. Negative right straight leg raise test and negative left straight leg raise test.       Legs:     Comments: There is no  swelling or ecchymosis of bilateral buttock.  There is tenderness palpation to bilateral upper gluteus maximus muscles.  Strength is 5 out of 5 bilateral lower extremities  Skin:    General: Skin is warm and dry.  Neurological:     General: No focal deficit present.     Mental Status: She is alert and oriented to person, place, and time.  Psychiatric:        Mood and Affect: Mood normal.        Behavior: Behavior normal.      UC Treatments / Results  Labs (all labs ordered are listed, but only abnormal results are displayed) Labs Reviewed - No data to display  EKG   Radiology No results found.  Procedures Procedures (including critical care time)  Medications Ordered in UC Medications  ketorolac  (TORADOL ) 30 MG/ML injection 30 mg (30 mg Intramuscular Given 11/22/23 1007)    Initial Impression / Assessment and Plan / UC Course  I have reviewed the triage vital signs and the nursing notes.  Pertinent labs & imaging results that were available during my care of the patient were reviewed by me and considered in my medical decision making (see chart for details).     Reviewed exam and symptoms with patient.  No red flags.  Discussed buttock muscle strain.  Patient given Toradol  injection in clinic.  Monitored for 10 minutes after injection with no reaction noted and tolerated well.  Instructed no NSAIDs for 24 hours and verbalized understanding.  Will do trial of Flexeril , side effect profile reviewed.  Patient reports she has had good improvement with similar symptoms in the past with prednisone, prednisone taper as prescribed.  Advised heat and rest.  PCP follow-up if symptoms do not improve.  ER precautions reviewed and patient verbalized understanding. Final Clinical Impressions(s) / UC Diagnoses   Final diagnoses:  Strain of left buttock, initial encounter  Strain of right buttock, initial encounter     Discharge Instructions      You were given a Toradol  injection in  clinic today. Do not take any over  the counter NSAID's such as Advil , ibuprofen , Aleve , or naproxen  for 24 hours. You may take tylenol  if needed.  Start Flexeril  today.  Please note this medication will make you drowsy.  Do not drink alcohol or drive while on this medication.  Start prednisone taper as prescribed.  Heat and rest to the affected muscles.  Please follow-up with your PCP if your symptoms do not improve.  Please go to the ER if you develop any worsening symptoms.  Hope you feel better soon!      ED Prescriptions     Medication Sig Dispense Auth. Provider   cyclobenzaprine  (FLEXERIL ) 10 MG tablet Take 1 tablet (10 mg total) by mouth 2 (two) times daily as needed for muscle spasms. 10 tablet Ashauna Bertholf, Jodi R, NP   predniSONE (STERAPRED UNI-PAK 21 TAB) 10 MG (21) TBPK tablet Take by mouth daily. Take 6 tabs by mouth daily  for 1 day, then 5 tabs for 1 day, then 4 tabs for 1 day, then 3 tabs for 1 day, 2 tabs for 1 day, then 1 tab by mouth daily for 1 days 21 tablet Deone Omahoney, Jodi R, NP      PDMP not reviewed this encounter.   Alleen Arbour, NP 11/22/23 1007

## 2024-01-06 ENCOUNTER — Other Ambulatory Visit: Payer: Self-pay | Admitting: Family Medicine

## 2024-01-06 DIAGNOSIS — Z1231 Encounter for screening mammogram for malignant neoplasm of breast: Secondary | ICD-10-CM

## 2024-01-07 ENCOUNTER — Ambulatory Visit
Admission: RE | Admit: 2024-01-07 | Discharge: 2024-01-07 | Disposition: A | Discharge status: Home / Self Care | Source: Ambulatory Visit | Attending: Family Medicine | Admitting: Family Medicine

## 2024-01-07 DIAGNOSIS — Z1231 Encounter for screening mammogram for malignant neoplasm of breast: Secondary | ICD-10-CM

## 2024-02-19 ENCOUNTER — Telehealth: Payer: Self-pay | Admitting: Internal Medicine

## 2024-02-19 NOTE — Telephone Encounter (Signed)
 Dr. Isaiah saw the patient on 11/20/23 and placed the order for new cpap setup. The order was sent to [facility]. We have received a note from [facility] she didn't want to do cpap setup at this time. I have spoke with Pamela Ellis and she stated she has anxiety and doesn't think she can do the cpap. She wants to talk with Dr. Isaiah about other options for sleep apena

## 2024-03-04 ENCOUNTER — Ambulatory Visit: Admitting: Internal Medicine

## 2024-03-04 ENCOUNTER — Encounter: Payer: Self-pay | Admitting: Internal Medicine

## 2024-03-04 VITALS — BP 120/80 | HR 87 | Temp 98.7°F | Ht 64.0 in | Wt 222.0 lb

## 2024-03-04 DIAGNOSIS — G4733 Obstructive sleep apnea (adult) (pediatric): Secondary | ICD-10-CM

## 2024-03-04 NOTE — Patient Instructions (Signed)
 Assessment of oral device for sleep apnea  Recommend weight loss

## 2024-03-04 NOTE — Progress Notes (Signed)
 Name: Pamela Ellis MRN: [id number] DOB: 03/07/1971    CHIEF COMPLAINT:  Assessment of sleep apnea   HISTORY OF PRESENT ILLNESS: Patient underwent split-night sleep study in November 2024 Results reviewed with patient in detail Patient diagnosed with mild OSA with AHI approximately 10 CPAP was not initiated  Patient has extensive amounts of fatigue tiredness Restless sleep Significant snoring issues Witnessed apneas Not refreshed sleep I recommend starting CPAP  No exacerbation at this time No evidence of heart failure at this time No evidence or signs of infection at this time No respiratory distress No fevers, chills, nausea, vomiting, diarrhea No evidence of lower extremity edema No evidence hemoptysis Patient did not start CPAP therapy due to severe anxiety I have explained other options to her  At this time patient may seek alternative therapy with oral device I have recommended that she discuss with her dentist in [place]  Patient has gained 60 pounds of the last 2 years Recommend weight loss   PAST MEDICAL HISTORY :   has a past medical history of Anxiety, GERD (gastroesophageal reflux disease), Hypertension, and Paresthesia.  has a past surgical history that includes Hernia repair. Prior to Admission medications   Medication Sig Start Date End Date Taking? Authorizing Provider  acetaminophen  (TYLENOL ) 500 MG tablet Take 1 tablet (500 mg total) by mouth every 6 (six) hours as needed. Patient not taking: Reported on 04/03/2022 06/16/15   Rose, Kayla, PA-C  amLODipine (NORVASC) 2.5 MG tablet Take 2.5 mg by mouth daily. 05/15/23   [provider]  benzonatate  (TESSALON ) 100 MG capsule Take 1 capsule (100 mg total) by mouth 3 (three) times daily as needed for cough. 05/18/22   Christopher Savannah, PA-C  cetirizine  (ZYRTEC  ALLERGY) 10 MG tablet Take 1 tablet (10 mg total) by mouth daily. 05/18/22   Christopher Savannah, PA-C  cyanocobalamin (VITAMIN B12) 500 MCG  tablet Take 500 mcg by mouth daily.    [provider]  cyclobenzaprine  (FLEXERIL ) 5 MG tablet Take 1 tablet (5 mg total) by mouth 3 (three) times daily as needed for muscle spasms. 06/04/23   Christopher Savannah, PA-C  dicyclomine  (BENTYL ) 20 MG tablet Take 1 tablet (20 mg total) by mouth 3 (three) times daily as needed for spasms. Patient not taking: Reported on 04/03/2022 12/24/21   Long, Joshua G, MD  Ergocalciferol (VITAMIN D2 PO) Take by mouth.    [provider]  escitalopram (LEXAPRO) 10 MG tablet Take 10 mg by mouth daily. 06/03/23   [provider]  gabapentin  (NEURONTIN ) 300 MG capsule Take 1 capsule (300 mg total) by mouth at bedtime. 04/03/22   Patel, Donika K, DO  hydrochlorothiazide (HYDRODIURIL) 25 MG tablet Take 25 mg by mouth daily. 02/11/23   [provider]  ibuprofen  (ADVIL ) 600 MG tablet Take 1 tablet (600 mg total) by mouth every 6 (six) hours as needed. 05/18/22   Christopher Savannah, PA-C  ipratropium (ATROVENT ) 0.03 % nasal spray Place 2 sprays into both nostrils 2 (two) times daily. 05/18/22   Christopher Savannah, PA-C  naproxen  (NAPROSYN ) 500 MG tablet Take 1 tablet (500 mg total) by mouth 2 (two) times daily with a meal. 06/04/23   Christopher Savannah, PA-C  omeprazole (PRILOSEC) 40 MG capsule Take 40 mg by mouth daily. 03/16/23   [provider]  pantoprazole  (PROTONIX ) 40 MG tablet Take 1 tablet (40 mg total) by mouth daily. Patient not taking: Reported on 04/03/2022 12/24/21   Long, Joshua G, MD  pregabalin (LYRICA) 75 MG  capsule Take 75 mg by mouth 2 (two) times daily. 05/18/23   [provider]  promethazine -dextromethorphan (PROMETHAZINE -DM) 6.25-15 MG/5ML syrup Take 2.5 mLs by mouth 3 (three) times daily as needed for cough. 05/18/22   Christopher Savannah, PA-C   No Known Allergies  FAMILY HISTORY:  family history includes Dementia in her mother; Hepatitis C in her father. SOCIAL HISTORY:  reports that she has quit smoking. Her smoking use included  cigarettes. She has never used smokeless tobacco. She reports current alcohol use. She reports that she does not use drugs.  LMP 04/20/2022 (Approximate)   BP 120/80 (BP Location: Right Arm, Patient Position: Sitting, Cuff Size: Normal)   Pulse 87   Temp 98.7 F (37.1 C) (Oral)   Ht 5' 4 (1.626 m)   Wt 222 lb (100.7 kg)   LMP 04/20/2022 (Approximate)   SpO2 97%   BMI 38.11 kg/m     Review of Systems: Gen:  Denies  fever, sweats, chills weight loss  HEENT: Denies blurred vision, double vision, ear pain, eye pain, hearing loss, nose bleeds, sore throat Cardiac:  No dizziness, chest pain or heaviness, chest tightness,edema, No JVD Resp:   No cough, -sputum production, -shortness of breath,-wheezing, -hemoptysis,  Other:  All other systems negative   Physical Examination:   General Appearance: No distress  EYES PERRLA, EOM intact.   NECK Supple, No JVD Pulmonary: normal breath sounds, No wheezing.  CardiovascularNormal S1,S2.  No m/r/g.   Abdomen: Benign, Soft, non-tender. Neurology UE/LE 5/5 strength, no focal deficits Ext pulses intact, cap refill intact ALL OTHER ROS ARE NEGATIVE   CBC    Component Value Date/Time   WBC 6.0 12/24/2021 1326   RBC 4.80 12/24/2021 1326   HGB 13.1 12/24/2021 1326   HCT 40.6 12/24/2021 1326   PLT 310 12/24/2021 1326   MCV 84.6 12/24/2021 1326   MCH 27.3 12/24/2021 1326   MCHC 32.3 12/24/2021 1326   RDW 13.3 12/24/2021 1326      Latest Ref Rng & Units 12/24/2021    1:26 PM  BMP  Glucose 70 - 99 mg/dL 899   BUN 6 - 20 mg/dL 8   Creatinine 9.55 - 8.99 mg/dL 9.22   Sodium 864 - 854 mmol/L 138   Potassium 3.5 - 5.1 mmol/L 4.1   Chloride 98 - 111 mmol/L 106   CO2 22 - 32 mmol/L 25   Calcium 8.9 - 10.3 mg/dL 9.3      ASSESSMENT AND PLAN SYNOPSIS  53 year old pleasant female patient with signs and symptoms of excessive daytime sleepiness with underlying diagnosis of obstructive sleep apnea in the setting of obesity and  deconditioned state with AHI of 10 with significant compromise in her sleep with restless sleep excessive daytime sleepiness and fatigue lack of energy  Recommend oral device assessment by dentist if he chooses to pursue therapy for sleep apnea Patient has severe anxiety therefore does not want to start CPAP therapy  Obesity -recommend significant weight loss -recommend changing diet  Deconditioned state -Recommend increased daily activity and exercise     CURRENT MEDICATIONS REVIEWED AT LENGTH WITH PATIENT TODAY   Patient  satisfied with Plan of action and management. All questions answered   Follow up as needed   I spent a total of 45 minutes dedicated to the care of this patient on the date of this encounter to include pre-visit review of records, face-to-face time with the patient discussing conditions above, post visit ordering of testing, clinical documentation with the  electronic health record, making appropriate referrals as documented, and communicating necessary information to the patient's healthcare team.    The Patient requires high complexity decision making for assessment and support, frequent evaluation and titration of therapies, application of advanced monitoring technologies and extensive interpretation of multiple databases.  Patient satisfied with Plan of action and management. All questions answered    Nickolas Alm Cellar, M.D.  [facility]  Medical Director [facility] Medical Director [facility] Cardio-Pulmonary Department

## 2024-07-20 NOTE — Progress Notes (Unsigned)
 "  Office Visit Note  Patient: Pamela Ellis             Date of Birth: [birth year]           MRN: [id number]             PCP: Ilah Crigler, MD Referring: Ilah Crigler, MD Visit Date: 07/29/2024 Occupation: Data Unavailable  Subjective:    History of Present Illness: Pamela Ellis is a 53 y.o. female who presents today for a new patient consultation.      Activities of Daily Living:  Patient reports morning stiffness for *** {minute/hour:19697}.   Patient {ACTIONS;DENIES/REPORTS:21021675::Denies} nocturnal pain.  Difficulty dressing/grooming: {ACTIONS;DENIES/REPORTS:21021675::Denies} Difficulty climbing stairs: {ACTIONS;DENIES/REPORTS:21021675::Denies} Difficulty getting out of chair: {ACTIONS;DENIES/REPORTS:21021675::Denies} Difficulty using hands for taps, buttons, cutlery, and/or writing: {ACTIONS;DENIES/REPORTS:21021675::Denies}  No Rheumatology ROS completed.   PMFS History:  Patient Active Problem List   Diagnosis Date Noted   Paresthesias 12/26/2021    Past Medical History:  Diagnosis Date   Anxiety    GERD (gastroesophageal reflux disease)    Hypertension    Paresthesia     Family History  Problem Relation Age of Onset   Dementia Mother    Hepatitis C Father    BRCA 1/2 Neg Hx    Breast cancer Neg Hx    Past Surgical History:  Procedure Laterality Date   HERNIA REPAIR     Social History[1] Social History   Social History Narrative   Right Handed    Lives in a one story home      Immunization History  Administered Date(s) Administered   Influenza, Quadrivalent, Recombinant, Inj, Pf 04/23/2019   Influenza-Unspecified 05/23/2023   Moderna Sars-Covid-2 Vaccination 10/14/2019, 11/16/2019     Objective: Vital Signs: LMP 04/20/2022    Physical Exam Vitals and nursing note reviewed.  Constitutional:      Appearance: She is well-developed.  HENT:     Head: Normocephalic and atraumatic.  Eyes:     Conjunctiva/sclera:  Conjunctivae normal.  Cardiovascular:     Rate and Rhythm: Normal rate and regular rhythm.     Heart sounds: Normal heart sounds.  Pulmonary:     Effort: Pulmonary effort is normal.     Breath sounds: Normal breath sounds.  Abdominal:     General: Bowel sounds are normal.     Palpations: Abdomen is soft.  Musculoskeletal:     Cervical back: Normal range of motion.  Lymphadenopathy:     Cervical: No cervical adenopathy.  Skin:    General: Skin is warm and dry.     Capillary Refill: Capillary refill takes less than 2 seconds.  Neurological:     Mental Status: She is alert and oriented to person, place, and time.  Psychiatric:        Behavior: Behavior normal.      Musculoskeletal Exam: ***  CDAI Exam: CDAI Score: -- Patient Global: --; Provider Global: -- Swollen: --; Tender: -- Joint Exam 07/29/2024   No joint exam has been documented for this visit   There is currently no information documented on the homunculus. Go to the Rheumatology activity and complete the homunculus joint exam.  Investigation: No additional findings.  Imaging: No results found.  Recent Labs: Lab Results  Component Value Date   WBC 6.0 12/24/2021   HGB 13.1 12/24/2021   PLT 310 12/24/2021   NA 138 12/24/2021   K 4.1 12/24/2021   CL 106 12/24/2021   CO2 25 12/24/2021   GLUCOSE 100 (H) 12/24/2021  BUN 8 12/24/2021   CREATININE 0.77 12/24/2021   BILITOT 0.5 12/24/2021   ALKPHOS 94 12/24/2021   AST 16 12/24/2021   ALT 11 12/24/2021   PROT 7.9 12/24/2021   ALBUMIN 3.8 12/24/2021   CALCIUM 9.3 12/24/2021    Speciality Comments: No specialty comments available.  Procedures:  No procedures performed Allergies: Patient has no known allergies.   Assessment / Plan:     Visit Diagnoses: Rheumatoid factor positive  Polyarthralgia  Essential hypertension  Gastroesophageal reflux disease without esophagitis  History of sleep apnea  Vitamin D deficiency  Orders: No orders of  the defined types were placed in this encounter.  No orders of the defined types were placed in this encounter.   Face-to-face time spent with patient was *** minutes. Greater than 50% of time was spent in counseling and coordination of care.  Follow-Up Instructions: No follow-ups on file.   Waddell CHRISTELLA Craze, PA-C  Note - This record has been created using Dragon software.  Chart creation errors have been sought, but may not always  have been located. Such creation errors do not reflect on  the standard of medical care.     [1]  Social History Tobacco Use   Smoking status: Former    Current packs/day: 0.20    Types: Cigarettes   Smokeless tobacco: Never  Vaping Use   Vaping status: Never Used  Substance Use Topics   Alcohol use: Yes    Comment: occasionally   Drug use: No   "

## 2024-07-29 ENCOUNTER — Ambulatory Visit: Admitting: Physician Assistant

## 2024-07-29 DIAGNOSIS — Z8669 Personal history of other diseases of the nervous system and sense organs: Secondary | ICD-10-CM

## 2024-07-29 DIAGNOSIS — M2550 Pain in unspecified joint: Secondary | ICD-10-CM

## 2024-07-29 DIAGNOSIS — E559 Vitamin D deficiency, unspecified: Secondary | ICD-10-CM

## 2024-07-29 DIAGNOSIS — I10 Essential (primary) hypertension: Secondary | ICD-10-CM

## 2024-07-29 DIAGNOSIS — K219 Gastro-esophageal reflux disease without esophagitis: Secondary | ICD-10-CM

## 2024-07-29 DIAGNOSIS — R7689 Other specified abnormal immunological findings in serum: Secondary | ICD-10-CM
